# Patient Record
Sex: Female | Born: 1960 | Race: White | Hispanic: No | Marital: Single | Smoking: Never smoker
Health system: Southern US, Community
[De-identification: ages and names within clinical notes are randomized; demographics above are authoritative.]

## PROBLEM LIST (undated history)

## (undated) DIAGNOSIS — E785 Hyperlipidemia, unspecified: Secondary | ICD-10-CM

## (undated) DIAGNOSIS — E119 Type 2 diabetes mellitus without complications: Secondary | ICD-10-CM

## (undated) DIAGNOSIS — F329 Major depressive disorder, single episode, unspecified: Secondary | ICD-10-CM

## (undated) DIAGNOSIS — F32A Depression, unspecified: Secondary | ICD-10-CM

## (undated) DIAGNOSIS — K859 Acute pancreatitis without necrosis or infection, unspecified: Secondary | ICD-10-CM

## (undated) HISTORY — DX: Type 2 diabetes mellitus without complications: E11.9

## (undated) HISTORY — PX: KNEE SURGERY: SHX244

## (undated) HISTORY — DX: Hyperlipidemia, unspecified: E78.5

## (undated) HISTORY — DX: Major depressive disorder, single episode, unspecified: F32.9

## (undated) HISTORY — DX: Depression, unspecified: F32.A

---

## 1999-02-18 ENCOUNTER — Emergency Department (HOSPITAL_COMMUNITY): Admission: EM | Admit: 1999-02-18 | Discharge: 1999-02-18 | Payer: Self-pay | Admitting: Emergency Medicine

## 2000-02-06 ENCOUNTER — Encounter: Admission: RE | Admit: 2000-02-06 | Discharge: 2000-02-06 | Payer: Self-pay | Admitting: Internal Medicine

## 2000-02-06 ENCOUNTER — Encounter: Payer: Self-pay | Admitting: Internal Medicine

## 2000-02-11 ENCOUNTER — Ambulatory Visit (HOSPITAL_BASED_OUTPATIENT_CLINIC_OR_DEPARTMENT_OTHER): Admission: RE | Admit: 2000-02-11 | Discharge: 2000-02-11 | Payer: Self-pay | Admitting: *Deleted

## 2000-05-26 ENCOUNTER — Encounter: Admission: RE | Admit: 2000-05-26 | Discharge: 2000-05-26 | Payer: Self-pay | Admitting: Internal Medicine

## 2000-05-26 ENCOUNTER — Encounter: Payer: Self-pay | Admitting: Internal Medicine

## 2015-12-31 ENCOUNTER — Inpatient Hospital Stay (HOSPITAL_COMMUNITY)
Admission: EM | Admit: 2015-12-31 | Discharge: 2016-01-04 | DRG: 440 | Disposition: A | Discharge status: Home / Self Care | Payer: BLUE CROSS/BLUE SHIELD | Attending: Internal Medicine | Admitting: Internal Medicine

## 2015-12-31 ENCOUNTER — Ambulatory Visit (INDEPENDENT_AMBULATORY_CARE_PROVIDER_SITE_OTHER): Payer: BLUE CROSS/BLUE SHIELD | Admitting: Emergency Medicine

## 2015-12-31 ENCOUNTER — Emergency Department (HOSPITAL_COMMUNITY): Payer: BLUE CROSS/BLUE SHIELD

## 2015-12-31 ENCOUNTER — Ambulatory Visit (INDEPENDENT_AMBULATORY_CARE_PROVIDER_SITE_OTHER): Payer: BLUE CROSS/BLUE SHIELD

## 2015-12-31 ENCOUNTER — Encounter (HOSPITAL_COMMUNITY): Payer: Self-pay

## 2015-12-31 VITALS — BP 150/80 | HR 89 | Temp 98.6°F | Resp 14 | Ht 64.0 in | Wt 195.0 lb

## 2015-12-31 DIAGNOSIS — F1010 Alcohol abuse, uncomplicated: Secondary | ICD-10-CM | POA: Diagnosis present

## 2015-12-31 DIAGNOSIS — R112 Nausea with vomiting, unspecified: Secondary | ICD-10-CM

## 2015-12-31 DIAGNOSIS — I160 Hypertensive urgency: Secondary | ICD-10-CM | POA: Diagnosis present

## 2015-12-31 DIAGNOSIS — Z8249 Family history of ischemic heart disease and other diseases of the circulatory system: Secondary | ICD-10-CM

## 2015-12-31 DIAGNOSIS — K760 Fatty (change of) liver, not elsewhere classified: Secondary | ICD-10-CM | POA: Diagnosis present

## 2015-12-31 DIAGNOSIS — E119 Type 2 diabetes mellitus without complications: Secondary | ICD-10-CM | POA: Diagnosis not present

## 2015-12-31 DIAGNOSIS — E669 Obesity, unspecified: Secondary | ICD-10-CM | POA: Diagnosis present

## 2015-12-31 DIAGNOSIS — I10 Essential (primary) hypertension: Secondary | ICD-10-CM | POA: Diagnosis present

## 2015-12-31 DIAGNOSIS — R1013 Epigastric pain: Secondary | ICD-10-CM | POA: Diagnosis not present

## 2015-12-31 DIAGNOSIS — K852 Alcohol induced acute pancreatitis without necrosis or infection: Secondary | ICD-10-CM | POA: Diagnosis not present

## 2015-12-31 DIAGNOSIS — F1910 Other psychoactive substance abuse, uncomplicated: Secondary | ICD-10-CM

## 2015-12-31 DIAGNOSIS — F329 Major depressive disorder, single episode, unspecified: Secondary | ICD-10-CM | POA: Diagnosis present

## 2015-12-31 DIAGNOSIS — K859 Acute pancreatitis without necrosis or infection, unspecified: Secondary | ICD-10-CM | POA: Diagnosis not present

## 2015-12-31 DIAGNOSIS — Z6832 Body mass index (BMI) 32.0-32.9, adult: Secondary | ICD-10-CM | POA: Diagnosis not present

## 2015-12-31 DIAGNOSIS — F32A Depression, unspecified: Secondary | ICD-10-CM

## 2015-12-31 DIAGNOSIS — K8500 Idiopathic acute pancreatitis without necrosis or infection: Secondary | ICD-10-CM | POA: Diagnosis not present

## 2015-12-31 DIAGNOSIS — R109 Unspecified abdominal pain: Secondary | ICD-10-CM | POA: Diagnosis not present

## 2015-12-31 LAB — URINALYSIS, ROUTINE W REFLEX MICROSCOPIC
BILIRUBIN URINE: NEGATIVE
GLUCOSE, UA: NEGATIVE mg/dL
Hgb urine dipstick: NEGATIVE
KETONES UR: 15 mg/dL — AB
Leukocytes, UA: NEGATIVE
Nitrite: NEGATIVE
PH: 6.5 (ref 5.0–8.0)
Protein, ur: 30 mg/dL — AB
Specific Gravity, Urine: 1.026 (ref 1.005–1.030)

## 2015-12-31 LAB — POCT CBC
GRANULOCYTE PERCENT: 83.1 % — AB (ref 37–80)
HEMATOCRIT: 43.3 % (ref 37.7–47.9)
Hemoglobin: 15.4 g/dL (ref 12.2–16.2)
Lymph, poc: 1.5 (ref 0.6–3.4)
MCH: 29.4 pg (ref 27–31.2)
MCHC: 35.5 g/dL — AB (ref 31.8–35.4)
MCV: 83 fL (ref 80–97)
MID (CBC): 0.2 (ref 0–0.9)
MPV: 9 fL (ref 0–99.8)
POC GRANULOCYTE: 8.1 — AB (ref 2–6.9)
POC LYMPH PERCENT: 15 %L (ref 10–50)
POC MID %: 1.9 % (ref 0–12)
Platelet Count, POC: 200 10*3/uL (ref 142–424)
RBC: 5.22 M/uL (ref 4.04–5.48)
RDW, POC: 13.8 %
WBC: 9.7 10*3/uL (ref 4.6–10.2)

## 2015-12-31 LAB — COMPREHENSIVE METABOLIC PANEL
ALBUMIN: 4.5 g/dL (ref 3.5–5.0)
ALK PHOS: 144 U/L — AB (ref 38–126)
ALT: 21 U/L (ref 14–54)
AST: 24 U/L (ref 15–41)
Anion gap: 11 (ref 5–15)
BUN: 15 mg/dL (ref 6–20)
CALCIUM: 10.3 mg/dL (ref 8.9–10.3)
CO2: 19 mmol/L — AB (ref 22–32)
CREATININE: 0.63 mg/dL (ref 0.44–1.00)
Chloride: 107 mmol/L (ref 101–111)
GFR calc non Af Amer: >60 mL/min (ref >60)
GLUCOSE: 213 mg/dL — AB (ref 65–99)
Potassium: 3.6 mmol/L (ref 3.5–5.1)
SODIUM: 137 mmol/L (ref 135–145)
Total Bilirubin: 2 mg/dL — ABNORMAL HIGH (ref 0.3–1.2)
Total Protein: 7.8 g/dL (ref 6.5–8.1)

## 2015-12-31 LAB — LIPASE, BLOOD: Lipase: 306 U/L — ABNORMAL HIGH (ref 11–51)

## 2015-12-31 LAB — CBC WITH DIFFERENTIAL/PLATELET
BASOS PCT: 0 %
Basophils Absolute: 0 10*3/uL (ref 0.0–0.1)
EOS PCT: 1 %
Eosinophils Absolute: 0.1 10*3/uL (ref 0.0–0.7)
HCT: 45.4 % (ref 36.0–46.0)
Hemoglobin: 14.8 g/dL (ref 12.0–15.0)
LYMPHS PCT: 13 %
Lymphs Abs: 1.2 10*3/uL (ref 0.7–4.0)
MCH: 27.6 pg (ref 26.0–34.0)
MCHC: 32.6 g/dL (ref 30.0–36.0)
MCV: 84.7 fL (ref 78.0–100.0)
MONO ABS: 0.6 10*3/uL (ref 0.1–1.0)
MONOS PCT: 7 %
NEUTROS ABS: 7.4 10*3/uL (ref 1.7–7.7)
Neutrophils Relative %: 79 %
Platelets: 244 10*3/uL (ref 150–400)
RBC: 5.36 MIL/uL — ABNORMAL HIGH (ref 3.87–5.11)
RDW: 13.5 % (ref 11.5–15.5)
WBC: 9.3 10*3/uL (ref 4.0–10.5)

## 2015-12-31 LAB — URINE MICROSCOPIC-ADD ON

## 2015-12-31 LAB — GLUCOSE, POCT (MANUAL RESULT ENTRY): POC Glucose: 210 mg/dl — AB (ref 70–99)

## 2015-12-31 LAB — GLUCOSE, CAPILLARY: GLUCOSE-CAPILLARY: 141 mg/dL — AB (ref 65–99)

## 2015-12-31 MED ORDER — ONDANSETRON HCL 4 MG/2ML IJ SOLN: 4.0000 mg | Freq: Four times a day (QID) | INTRAMUSCULAR | Status: DC | PRN

## 2015-12-31 MED ORDER — LISINOPRIL 20 MG PO TABS
40.0000 mg | ORAL_TABLET | Freq: Every day | ORAL | Status: DC
  Administered 2015-12-31 – 2016-01-04 (×5): 40 mg via ORAL
  Filled 2015-12-31: qty 1
  Filled 2015-12-31 (×4): qty 2

## 2015-12-31 MED ORDER — ENOXAPARIN SODIUM 40 MG/0.4ML ~~LOC~~ SOLN
40.0000 mg | SUBCUTANEOUS | Status: DC
  Administered 2015-12-31 – 2016-01-03 (×4): 40 mg via SUBCUTANEOUS
  Filled 2015-12-31 (×5): qty 0.4

## 2015-12-31 MED ORDER — MORPHINE SULFATE (PF) 4 MG/ML IV SOLN
INTRAVENOUS | Status: AC
  Filled 2015-12-31: qty 1

## 2015-12-31 MED ORDER — ONDANSETRON HCL 4 MG PO TABS: 4.0000 mg | ORAL_TABLET | Freq: Four times a day (QID) | ORAL | Status: DC | PRN

## 2015-12-31 MED ORDER — FOLIC ACID 1 MG PO TABS: 1.0000 mg | ORAL_TABLET | Freq: Every day | ORAL | Status: DC

## 2015-12-31 MED ORDER — ONDANSETRON HCL 4 MG/2ML IJ SOLN
4.0000 mg | Freq: Once | INTRAMUSCULAR | Status: AC
  Administered 2015-12-31: 4 mg via INTRAVENOUS
  Filled 2015-12-31: qty 2

## 2015-12-31 MED ORDER — ADULT MULTIVITAMIN W/MINERALS CH
1.0000 | ORAL_TABLET | Freq: Every day | ORAL | Status: DC
  Administered 2016-01-01 – 2016-01-04 (×4): 1 via ORAL
  Filled 2015-12-31 (×4): qty 1

## 2015-12-31 MED ORDER — INSULIN GLARGINE 100 UNIT/ML ~~LOC~~ SOLN
6.0000 [IU] | Freq: Every day | SUBCUTANEOUS | Status: DC
  Administered 2015-12-31 – 2016-01-03 (×4): 6 [IU] via SUBCUTANEOUS
  Filled 2015-12-31 (×4): qty 0.06

## 2015-12-31 MED ORDER — PANTOPRAZOLE SODIUM 40 MG IV SOLR: 40.0000 mg | INTRAVENOUS | Status: DC

## 2015-12-31 MED ORDER — BUPROPION HCL ER (SR) 150 MG PO TB12
150.0000 mg | ORAL_TABLET | Freq: Two times a day (BID) | ORAL | Status: DC
  Administered 2015-12-31 – 2016-01-04 (×8): 150 mg via ORAL
  Filled 2015-12-31 (×12): qty 1

## 2015-12-31 MED ORDER — PANTOPRAZOLE SODIUM 40 MG IV SOLR
40.0000 mg | INTRAVENOUS | Status: DC
  Administered 2015-12-31 – 2016-01-03 (×4): 40 mg via INTRAVENOUS
  Filled 2015-12-31 (×4): qty 40

## 2015-12-31 MED ORDER — LORAZEPAM 1 MG PO TABS: 1.0000 mg | ORAL_TABLET | Freq: Four times a day (QID) | ORAL | Status: AC | PRN

## 2015-12-31 MED ORDER — HYDRALAZINE HCL 20 MG/ML IJ SOLN: 10.0000 mg | Freq: Four times a day (QID) | INTRAMUSCULAR | Status: DC | PRN

## 2015-12-31 MED ORDER — HYDROMORPHONE HCL 1 MG/ML IJ SOLN
1.0000 mg | INTRAMUSCULAR | Status: AC | PRN
  Administered 2015-12-31 – 2016-01-01 (×3): 1 mg via INTRAVENOUS
  Filled 2015-12-31 (×3): qty 1

## 2015-12-31 MED ORDER — METOPROLOL TARTRATE 1 MG/ML IV SOLN
5.0000 mg | Freq: Four times a day (QID) | INTRAVENOUS | Status: DC
  Administered 2015-12-31 – 2016-01-01 (×3): 5 mg via INTRAVENOUS
  Filled 2015-12-31 (×3): qty 5

## 2015-12-31 MED ORDER — THIAMINE HCL 100 MG/ML IJ SOLN: 100.0000 mg | Freq: Every day | INTRAMUSCULAR | Status: DC

## 2015-12-31 MED ORDER — ADULT MULTIVITAMIN W/MINERALS CH: 1.0000 | ORAL_TABLET | Freq: Every day | ORAL | Status: DC

## 2015-12-31 MED ORDER — LORAZEPAM 2 MG/ML IJ SOLN: 1.0000 mg | Freq: Four times a day (QID) | INTRAMUSCULAR | Status: AC | PRN

## 2015-12-31 MED ORDER — FLUOXETINE HCL 20 MG PO CAPS
60.0000 mg | ORAL_CAPSULE | Freq: Every morning | ORAL | Status: DC
  Administered 2016-01-01 – 2016-01-04 (×4): 60 mg via ORAL
  Filled 2015-12-31 (×4): qty 3

## 2015-12-31 MED ORDER — VITAMIN B-1 100 MG PO TABS
100.0000 mg | ORAL_TABLET | Freq: Every day | ORAL | Status: DC
  Administered 2016-01-01 – 2016-01-04 (×4): 100 mg via ORAL
  Filled 2015-12-31 (×4): qty 1

## 2015-12-31 MED ORDER — IOHEXOL 300 MG/ML  SOLN
25.0000 mL | Freq: Once | INTRAMUSCULAR | Status: AC | PRN
  Administered 2015-12-31: 25 mL via ORAL

## 2015-12-31 MED ORDER — ACETAMINOPHEN 325 MG PO TABS: 650.0000 mg | ORAL_TABLET | Freq: Four times a day (QID) | ORAL | Status: DC | PRN

## 2015-12-31 MED ORDER — SODIUM CHLORIDE 0.9 % IV BOLUS (SEPSIS)
1000.0000 mL | Freq: Once | INTRAVENOUS | Status: AC
  Administered 2015-12-31: 1000 mL via INTRAVENOUS

## 2015-12-31 MED ORDER — HYDRALAZINE HCL 20 MG/ML IJ SOLN
10.0000 mg | Freq: Once | INTRAMUSCULAR | Status: AC
  Administered 2015-12-31: 10 mg via INTRAVENOUS
  Filled 2015-12-31: qty 1

## 2015-12-31 MED ORDER — INSULIN ASPART 100 UNIT/ML ~~LOC~~ SOLN
0.0000 [IU] | SUBCUTANEOUS | Status: DC
  Administered 2015-12-31 – 2016-01-01 (×2): 1 [IU] via SUBCUTANEOUS

## 2015-12-31 MED ORDER — MORPHINE SULFATE (PF) 4 MG/ML IV SOLN
4.0000 mg | Freq: Once | INTRAVENOUS | Status: AC
  Administered 2015-12-31: 4 mg via INTRAVENOUS
  Filled 2015-12-31: qty 1

## 2015-12-31 MED ORDER — THIAMINE HCL 100 MG/ML IJ SOLN
100.0000 mg | Freq: Every day | INTRAMUSCULAR | Status: DC
  Administered 2015-12-31: 100 mg via INTRAVENOUS
  Filled 2015-12-31: qty 2

## 2015-12-31 MED ORDER — SODIUM CHLORIDE 0.45 % IV SOLN
INTRAVENOUS | Status: DC
  Administered 2015-12-31 – 2016-01-04 (×8): via INTRAVENOUS

## 2015-12-31 MED ORDER — ACETAMINOPHEN 650 MG RE SUPP: 650.0000 mg | Freq: Four times a day (QID) | RECTAL | Status: DC | PRN

## 2015-12-31 MED ORDER — SODIUM CHLORIDE 0.9 % IV SOLN
INTRAVENOUS | Status: AC
  Administered 2015-12-31: 20:00:00 via INTRAVENOUS

## 2015-12-31 MED ORDER — VITAMIN B-1 100 MG PO TABS: 100.0000 mg | ORAL_TABLET | Freq: Every day | ORAL | Status: DC

## 2015-12-31 MED ORDER — ONDANSETRON HCL 4 MG/2ML IJ SOLN: 4.0000 mg | Freq: Three times a day (TID) | INTRAMUSCULAR | Status: DC | PRN

## 2015-12-31 MED ORDER — IOHEXOL 300 MG/ML  SOLN
100.0000 mL | Freq: Once | INTRAMUSCULAR | Status: AC | PRN
  Administered 2015-12-31: 100 mL via INTRAVENOUS

## 2015-12-31 MED ORDER — MORPHINE SULFATE (PF) 2 MG/ML IV SOLN
2.0000 mg | INTRAVENOUS | Status: DC | PRN
  Administered 2016-01-01 (×4): 2 mg via INTRAVENOUS
  Filled 2015-12-31 (×6): qty 1

## 2015-12-31 MED ORDER — FOLIC ACID 1 MG PO TABS
1.0000 mg | ORAL_TABLET | Freq: Every day | ORAL | Status: DC
  Administered 2016-01-01 – 2016-01-04 (×4): 1 mg via ORAL
  Filled 2015-12-31 (×4): qty 1

## 2015-12-31 MED ORDER — ALUM & MAG HYDROXIDE-SIMETH 200-200-20 MG/5ML PO SUSP: 30.0000 mL | Freq: Four times a day (QID) | ORAL | Status: DC | PRN

## 2015-12-31 NOTE — Patient Instructions (Addendum)
Please go to [place] emergency room for further evaluation 

## 2015-12-31 NOTE — Progress Notes (Addendum)
This chart was scribed for Earl Lites, MD by [facility], medical scribe at [facility].The patient was seen in exam room 14 and the patient's care was started at 11:20 AM.  Chief Complaint:  Chief Complaint  Patient presents with  . Abdominal Pain    1 day in the epigastric area, tender to tough  . Emesis   HPI: Alyssa Garcia is a 55 y.o. female who reports to [facility] today complaining of epigastric abdominal pain which began yesterday morning. Initially felt constipated and bloated. She did vomit this morning and yesterday but no diarrhea. No fever but she did feel hot. No surgical history on her abdomen. No smoking. She has not had a drink in 4 months.  Past Medical History  Diagnosis Date  . Depression   . Hyperlipidemia   . Diabetes mellitus without complication (HCC)    No past surgical history on file. Social History   Social History  . Marital Status: Single    Spouse Name: N/A  . Number of Children: N/A  . Years of Education: N/A   Social History Main Topics  . Smoking status: Never Smoker   . Smokeless tobacco: Not on file  . Alcohol Use: No  . Drug Use: No  . Sexual Activity: Not on file   Other Topics Concern  . Not on file   Social History Narrative  . No narrative on file   No family history on file. No Known Allergies Prior to Admission medications   Medication Sig Start Date End Date Taking? Authorizing Provider  buPROPion (WELLBUTRIN) 75 MG tablet Take 75 mg by mouth 2 (two) times daily.   Yes Historical Provider, MD  FLUoxetine (PROZAC) 10 MG capsule Take 10 mg by mouth daily.   Yes Historical Provider, MD  lisinopril (PRINIVIL,ZESTRIL) 20 MG tablet Take 20 mg by mouth daily.   Yes Historical Provider, MD  metFORMIN (GLUCOPHAGE) 1000 MG tablet Take 1,000 mg by mouth 2 (two) times daily with a meal.   Yes Historical Provider, MD  metoprolol succinate (TOPROL-XL) 25 MG 24 hr tablet Take 25 mg by mouth daily.   Yes Historical  Provider, MD    ROS: The patient denies fevers, chills, night sweats, unintentional weight loss, chest pain, palpitations, wheezing, dyspnea on exertion,  dysuria, hematuria, melena, numbness, weakness, or tingling.  All other systems have been reviewed and were otherwise negative with the exception of those mentioned in the HPI and as above.    PHYSICAL EXAM: Filed Vitals:   12/31/15 1045  BP: 150/80  Pulse: 89  Temp: 98.6 F (37 C)  Resp: 14   Body mass index is 33.46 kg/(m^2).  General: Alert, no acute distress, ruddy complexion. HEENT:  Normocephalic, atraumatic, oropharynx patent. Eye: [facility] Cardiovascular:  Regular rate and rhythm, no rubs murmurs or gallops.  No Carotid bruits, radial pulse intact. No pedal edema.  Respiratory: Clear to auscultation bilaterally.  No wheezes, rales, or rhonchi.  No cyanosis, no use of accessory musculature Abdominal: Positive bowel sounds.  No masses. Significant mid-epigastric tenderness. There appears to be localized rebound to this area. Musculoskeletal: Gait intact. No edema, tenderness Skin: No rashes. Neurologic: Facial musculature symmetric. Psychiatric: Patient acts appropriately throughout our interaction. Lymphatic: No cervical or submandibular lymphadenopathy Genitourinary/Anorectal: No acute findings   LABS: No results found for this or any previous visit. Results for orders placed or performed in visit on 12/31/15  POCT glucose (manual entry)  Result Value  Ref Range   POC Glucose 210 (A) 70 - 99 mg/dl  POCT CBC  Result Value Ref Range   WBC 9.7 4.6 - 10.2 K/uL   Lymph, poc 1.5 0.6 - 3.4   POC LYMPH PERCENT 15.0 10 - 50 %L   MID (cbc) 0.2 0 - 0.9   POC MID % 1.9 0 - 12 %M   POC Granulocyte 8.1 (A) 2 - 6.9   Granulocyte percent 83.1 (A) 37 - 80 %G   RBC 5.22 4.04 - 5.48 M/uL   Hemoglobin 15.4 12.2 - 16.2 g/dL   HCT, POC 16.1 09.6 - 47.9 %   MCV 83.0 80 - 97 fL   MCH, POC 29.4 27 - 31.2 pg   MCHC 35.5 (A)  31.8 - 35.4 g/dL   RDW, POC 04.5 %   Platelet Count, POC 200 142 - 424 K/uL   MPV 9.0 0 - 99.8 fL   EKG/XRAY:   Primary read interpreted by Dr. Cleta Alberts at [facility]. Dg Abd Acute W/chest  12/31/2015  CLINICAL DATA:  Patient with diffuse abdominal pain, nausea and vomiting. EXAM: DG ABDOMEN ACUTE W/ 1V CHEST COMPARISON:  None. FINDINGS: Normal cardiac and mediastinal contours. No consolidative pulmonary opacities. No pleural effusion or pneumothorax. Gas is demonstrated within nondilated loops of large and small bowel in a nonobstructed pattern. No free intraperitoneal air. Unremarkable osseous skeleton. IMPRESSION: No acute cardiopulmonary process. Nonobstructed bowel gas pattern. Electronically Signed   By: Annia Belt M.D.   On: 12/31/2015 12:05   ASSESSMENT/PLAN: Patient has significant upper abdominal discomfort. Plain films are unremarkable white count normal. She was unable to obtain urine. Patient advised to register to be seen in the emergency room at [address] and evaluated for upper abdominal pain rule out pancreatitis rule out penetrating ulcer rule out gallbladder disease. We were not able to obtain a urine here in our office. Gross sideeffects, risk and benefits, and alternatives of medications d/w patient. Patient is aware that all medications have potential sideeffects and we are unable to predict every sideeffect or drug-drug interaction that may occur.  By signing my name below, I, Nadim Abuhashem, attest that this documentation has been prepared under the direction and in the presence of Earl Lites, MD.  Electronically Signed: Conchita Paris, medical scribe. 12/31/2015, 11:27 AM.  Lesle Chris MD 12/31/2015 11:20 AM

## 2015-12-31 NOTE — H&P (Addendum)
[facility] History and Physical  Alyssa Garcia WJX:[id number] DOB: 08/25/1961 DOA: 12/31/2015   PCP: Pcp Not In System    Chief Complaint: abdominal pain, vomiting  HPI: Alyssa Garcia is a 55 y.o. female with DM, depression, HTN presents with 2 days of mid abdominal pain, aching in nature, 8/10 with vomiting. No fevers and no diarrhea. She is found by blood work and imaging to have acute pancreatitis. She binge drinks and last drank about 5 days ago. No h/o gallstones or hypertriglyceridemia. No h/o pancreatitis in the past.     General: The patient denies anorexia, fever, weight loss of 50 lb in 6 months due to being sick in the hospital Cardiac: Denies chest pain, syncope, palpitations, pedal edema  Respiratory: Denies cough, shortness of breath, wheezing GI: Denies severe indigestion/heartburn, abdominal pain, nausea, vomiting, diarrhea and constipation GU: Denies hematuria, incontinence, dysuria  Musculoskeletal: mild arthritis  Skin: Denies suspicious skin lesions Neurologic: Denies focal weakness or numbness, change in vision Psychiatry: Denies depression or anxiety. Hematologic: + easy  bruising   All other systems reviewed and found to be negative.  Past Medical History  Diagnosis Date  . Depression   . Hyperlipidemia   . Diabetes mellitus without complication [facility])     Past Surgical History  Procedure Laterality Date  . Knee surgery      Social History: drank 2 5th of Vodka on Monday and Tuesday- drinks intermittently does not smoke On Medical leave and currently not working.    No Known Allergies  Family history:  Grandmother had heart disease. Sister has COPD    Prior to Admission medications   Medication Sig Start Date End Date Taking? Authorizing Provider  buPROPion (WELLBUTRIN SR) 150 MG 12 hr tablet Take 150 mg by mouth 2 (two) times daily. 11/03/15  Yes Historical Provider, MD  FLUoxetine (PROZAC) 20 MG capsule Take 60 mg by mouth every  morning. 11/03/15  Yes Historical Provider, MD  ibuprofen (ADVIL,MOTRIN) 200 MG tablet Take 400 mg by mouth every 6 (six) hours as needed for mild pain or moderate pain.   Yes Historical Provider, MD  LANTUS SOLOSTAR 100 UNIT/ML Solostar Pen Inject 10-12 Units into the skin 2 (two) times daily. 12 units every morning and 10 every night 12/24/15  Yes Historical Provider, MD  lisinopril (PRINIVIL,ZESTRIL) 40 MG tablet Take 40 mg by mouth daily. 11/30/15  Yes Historical Provider, MD  metFORMIN (GLUCOPHAGE) 1000 MG tablet Take 1,000 mg by mouth 2 (two) times daily with a meal.   Yes Historical Provider, MD  metoprolol (LOPRESSOR) 100 MG tablet Take 100 mg by mouth daily. 11/30/15  Yes Historical Provider, MD  omeprazole (PRILOSEC OTC) 20 MG tablet Take 20 mg by mouth daily.   Yes Historical Provider, MD     Physical Exam: Filed Vitals:   12/31/15 1343 12/31/15 1601 12/31/15 1738  BP: 183/109 190/100 198/105  Pulse: 88 88 95  Temp: 98.2 F (36.8 C)    TempSrc: Oral    Resp: SpO2: 100% 100% 100%     General: AAO x3, no acute distress HEENT: Normocephalic and Atraumatic, Mucous membranes pink                PERRLA; EOM intact; No scleral icterus,                 Nares: Patent, OropharyMICHIAH MASSEFair Dentition  Neck: FROM, no cervical lymphadenopathy, thyromegaly, carotid bruit or JVD;  Breasts: deferred CHEST WALL: No tenderness  CHEST: Normal respiration, clear to auscultation bilaterally  HEART: Regular rate and rhythm; no murmurs rubs or gallops  BACK: No kyphosis or scoliosis; no CVA tenderness  GI: Positive Bowel Sounds, soft,  Tender in mid abdomen; no masses, no organomegaly Rectal Exam: deferred MSK: No cyanosis, clubbing, or edema Genitalia: not examined  SKIN:  no rash or ulceration  CNS: Alert and Oriented x 4, Nonfocal exam, CN 2-12 intact  Labs on Admission:  Basic Metabolic Panel:  Recent Labs Lab 12/31/15 1418  NA 137  K 3.6  CL 107  CO2  19*  GLUCOSE 213*  BUN 15  CREATININE 0.63  CALCIUM 10.3   Liver Function Tests:  Recent Labs Lab 12/31/15 1418  AST 24  ALT 21  ALKPHOS 144*  BILITOT 2.0*  PROT 7.8  ALBUMIN 4.5    Recent Labs Lab 12/31/15 1418  LIPASE 306*   No results for input(s): AMMONIA in the last 168 hours. CBC:  Recent Labs Lab 12/31/15 1209 12/31/15 1418  WBC 9.7 9.3  NEUTROABS  --  7.4  HGB 15.4 14.8  HCT 43.3 45.4  MCV 83.0 84.7  PLT  --  244   Cardiac Enzymes: No results for input(s): CKTOTAL, CKMB, CKMBINDEX, TROPONINI in the last 168 hours.  BNP (last 3 results) No results for input(s): BNP in the last 8760 hours.  ProBNP (last 3 results) No results for input(s): PROBNP in the last 8760 hours.  CBG: No results for input(s): GLUCAP in the last 168 hours.  Radiological Exams on Admission: Ct Abdomen Pelvis W Contrast  12/31/2015  CLINICAL DATA:  Mid abdominal pain with nausea vomiting and diarrhea. Elevated serum lipase level EXAM: CT ABDOMEN AND PELVIS WITH CONTRAST TECHNIQUE: Multidetector CT imaging of the abdomen and pelvis was performed using the standard protocol following bolus administration of intravenous contrast. CONTRAST:  OMNIPAQUE IOHEXOL 300 MG/ML SOLN, 25mL OMNIPAQUE IOHEXOL 300 MG/ML SOLN COMPARISON:  12/31/2015 ; report dated 02/06/2000 FINDINGS: Lower chest:  Mild cardiomegaly. Hepatobiliary: Mild diffuse hepatic steatosis. Pancreas: Diffuse peripancreatic stranding compatible with acute pancreatitis. No findings of necrosis, abscess, or pseudocyst. Spleen: Unremarkable Adrenals/Urinary Tract: Unremarkable Stomach/Bowel: Unremarkable Vascular/Lymphatic: Upper peripancreatic lymph node 1.5 cm in short axis on image 22 series 2. Portacaval node 1.3 cm in short axis, image 24 series 2. Porta hepatis node 1.3 cm in short axis, image 27 series 2. These lymph nodes are likely reactive. Reproductive: Unremarkable Other: Small amount of perihepatic and perisplenic  ascites. Trace fluid in the paracolic gutters. Musculoskeletal: Lower lumbar spondylosis and degenerative disc disease. A transitional lumbosacral vertebra is assumed to represent the S1 level. Careful correlation with this numbering strategy prior to any procedural intervention would be recommended. There is potentially prominent central narrowing of the thecal sac at the L4-5 level especially eccentric to the left due to disc bulge and disc protrusion along with at least mild left foraminal stenosis at this level. There is also intervertebral spurring at the L5-S1 level. Infraumbilical hernia contains adipose tissue. IMPRESSION: 1. Acute pancreatitis. No pancreatic abscess, pseudocyst, or necrosis. Peripancreatic stranding observed with a small amount of perihepatic and perisplenic ascites. Surrounding porta hepatis reactive lymph nodes. 2. Lumbar spondylosis and degenerative disc disease potentially with considerable impingement at the L4-5 level (please note that S1 is transitional). 3. Infraumbilical hernia contains adipose tissue. 4. Mild cardiomegaly. 5. Mild diffuse hepatic steatosis. Electronically Signed  By: Gaylyn Rong M.D.   On: 12/31/2015 17:04   Dg Abd Acute W/chest  12/31/2015  CLINICAL DATA:  Patient with diffuse abdominal pain, nausea and vomiting. EXAM: DG ABDOMEN ACUTE W/ 1V CHEST COMPARISON:  None. FINDINGS: Normal cardiac and mediastinal contours. No consolidative pulmonary opacities. No pleural effusion or pneumothorax. Gas is demonstrated within nondilated loops of large and small bowel in a nonobstructed pattern. No free intraperitoneal air. Unremarkable osseous skeleton. IMPRESSION: No acute cardiopulmonary process. Nonobstructed bowel gas pattern. Electronically Signed   By: Annia Belt M.D.   On: 12/31/2015 12:05      Assessment/Plan Principal Problem:   Acute pancreatitis - possibly from ETOH abuse- check Triglycerides- no gallstones noted on CT scan - NPO, IVF and  symptomatic treatment of pain and nausea  Active Problems:   Hypertensive urgency - IV Lopressor and Hydralazine ordered - she takes Lisinopril and Metoprolol at home - will need to give 1/2 NS rather than NS to prevent further rise in BP from sodium  ETOH abuse - if she does binge drink as she states, she should not have any withdrawal as she last drank about 5 days ago for 2 days in a row - will follow for withdrawal  Hepatic steatosis - either due to ETOH use or NASH or both    DM type 2 goal A1C below 7.5 - start insulin sliding scale Q 4 hrs- she takes Metformin which will be held - she states her last A1c was 6    Depression - cont Welbutrin and Prozac  Obesity   Consulted: none  Code Status: Full code Family Communication:  (POA) Raliegh Scarlet [phone] DVT Prophylaxis:Lovenox  Time spent: 50 min  Aldine Chakraborty, MD [facility]  If 7PM-7AM, please contact night-coverage [email or web address] 12/31/2015, 6:35 PM

## 2015-12-31 NOTE — ED Notes (Signed)
Patient transported to CT 

## 2015-12-31 NOTE — Addendum Note (Signed)
Addended by: Damita Lack on: 12/31/2015 01:43 PM   Modules accepted: Orders

## 2015-12-31 NOTE — ED Notes (Signed)
Pt cannot use restroom at this time, aware urine specimen is needed.  

## 2015-12-31 NOTE — ED Notes (Signed)
Dr. Butler Denmark paged in regards to patient's blood pressure. Medication orders given. Patient will be transported following medication administration. Dr. Butler Denmark is at the bedside now. Lupita Leash, RN on 3rd called and updated.

## 2015-12-31 NOTE — ED Provider Notes (Signed)
CSN: [id number]     Arrival date & time 12/31/15  1326 History   First MD Initiated Contact with Patient 12/31/15 1542     Chief Complaint  Patient presents with  . Abdominal Pain  . Emesis     (Consider location/radiation/quality/duration/timing/severity/associated sxs/prior Treatment) The history is provided by the patient and medical records.     Pt with hx HLD, DM, depression sent from [facility] for concerning epigastric pain.  Pt has had epigastric pain that is "full" and radiates to the back that began yesterday, associated sensation of bloating and constipation, multiple episodes of vomiting, has felt hot.  Emesis was contents of her stomach and then yellow.  Had several loose nonbloody bowel movements today after taking sennacot.  She also took zantac and pepto bismol without relief.  She has never been diagnosed with gallstones.  Notes she is a heavy ETOH drinker but has been trying to cut down, last drink was "earlier this week."  Has never had pancreatitis before.  No hx abdominal surgeries.   Acute abd series and CBC POCT performed at Urgent Care without significant abnormality.   Past Medical History  Diagnosis Date  . Depression   . Hyperlipidemia   . Diabetes mellitus without complication [facility])    Past Surgical History  Procedure Laterality Date  . Knee surgery     History reviewed. No pertinent family history. Social History  Substance Use Topics  . Smoking status: Never Smoker   . Smokeless tobacco: None  . Alcohol Use: No   OB History    No data available     Review of Systems  All other systems reviewed and are negative.     Allergies  Review of patient's allergies indicates no known allergies.  Home Medications   Prior to Admission medications   Medication Sig Start Date End Date Taking? Authorizing Provider  buPROPion (WELLBUTRIN) 75 MG tablet Take 75 mg by mouth 2 (two) times daily.    Historical Provider, MD  FLUoxetine  (PROZAC) 10 MG capsule Take 10 mg by mouth daily.    Historical Provider, MD  lisinopril (PRINIVIL,ZESTRIL) 20 MG tablet Take 20 mg by mouth daily.    Historical Provider, MD  metFORMIN (GLUCOPHAGE) 1000 MG tablet Take 1,000 mg by mouth 2 (two) times daily with a meal.    Historical Provider, MD  metoprolol succinate (TOPROL-XL) 25 MG 24 hr tablet Take 25 mg by mouth daily.    Historical Provider, MD   BP 183/109 mmHg  Pulse 88  Temp(Src) 98.2 F (36.8 C) (Oral)  Resp 18  SpO2 100% Physical Exam  Constitutional: She appears well-developed and well-nourished. No distress.  HENT:  Head: Normocephalic and atraumatic.  Neck: Neck supple.  Cardiovascular: Normal rate and regular rhythm.   Pulmonary/Chest: Effort normal and breath sounds normal. No respiratory distress. She has no wheezes. She has no rales.  Abdominal: Soft. There is tenderness in the epigastric area. There is no rebound and no guarding.  Neurological: She is alert.  Skin: She is not diaphoretic.  Nursing note and vitals reviewed.   ED Course  Procedures (including critical care time) Labs Review Labs Reviewed  LIPASE, BLOOD - Abnormal; Notable for the following:    Lipase 306 (*)    All other components within normal limits  COMPREHENSIVE METABOLIC PANEL - Abnormal; Notable for the following:    CO2 19 (*)    Glucose, Bld 213 (*)    Alkaline Phosphatase 144 (*)  Total Bilirubin 2.0 (*)    All other components within normal limits  URINALYSIS, ROUTINE W REFLEX MICROSCOPIC (NOT AT [facility])  CBC WITH DIFFERENTIAL/PLATELET    Imaging Review Dg Abd Acute W/chest  12/31/2015  CLINICAL DATA:  Patient with diffuse abdominal pain, nausea and vomiting. EXAM: DG ABDOMEN ACUTE W/ 1V CHEST COMPARISON:  None. FINDINGS: Normal cardiac and mediastinal contours. No consolidative pulmonary opacities. No pleural effusion or pneumothorax. Gas is demonstrated within nondilated loops of large and small bowel in a nonobstructed  pattern. No free intraperitoneal air. Unremarkable osseous skeleton. IMPRESSION: No acute cardiopulmonary process. Nonobstructed bowel gas pattern. Electronically Signed   By: Lovey Newcomer M.D.   On: 12/31/2015 12:05   I have personally reviewed and evaluated these images and lab results as part of my medical decision-making.   EKG Interpretation None       5:42 PM Pt admitted to [facility], Dr Wynelle Cleveland accepting.    MDM   Final diagnoses:  Acute pancreatitis, unspecified pancreatitis type  Alcohol abuse    Afebrile nontoxic patient p/w epigastric pain, N/V.  Labs significant for lipase 306, blood glucose 213, bilirubin2, alk phos 144.  Labs c/w pancreatitis.  This is patient's first episode of pancreatitis, likely related to ETOH use.  CT abd/pelvis demonstrates pancreatitis without complication.  IVF, pain, nausea medications given.  Admitted to [facility].     [place], PA-C 01/01/16 5300  Sherwood Gambler, MD 01/02/16 0040

## 2015-12-31 NOTE — ED Notes (Signed)
Bed: BM84 Expected date:  Expected time:  Means of arrival:  Comments: EMS- 55 yo ? Urostomy infection

## 2015-12-31 NOTE — ED Notes (Signed)
Per pt, here with abdominal pain and emesis.  No fever.  Some loose stool.  Pt went to urgent care and sent here.

## 2016-01-01 LAB — COMPREHENSIVE METABOLIC PANEL
ALT: 16 U/L (ref 14–54)
ANION GAP: 10 (ref 5–15)
AST: 17 U/L (ref 15–41)
Albumin: 3.7 g/dL (ref 3.5–5.0)
Alkaline Phosphatase: 117 U/L (ref 38–126)
BUN: 11 mg/dL (ref 6–20)
CALCIUM: 9.4 mg/dL (ref 8.9–10.3)
CO2: 22 mmol/L (ref 22–32)
CREATININE: 0.66 mg/dL (ref 0.44–1.00)
Chloride: 107 mmol/L (ref 101–111)
GFR calc non Af Amer: >60 mL/min (ref >60)
GLUCOSE: 120 mg/dL — AB (ref 65–99)
Potassium: 3.3 mmol/L — ABNORMAL LOW (ref 3.5–5.1)
SODIUM: 139 mmol/L (ref 135–145)
Total Bilirubin: 1.1 mg/dL (ref 0.3–1.2)
Total Protein: 6.4 g/dL — ABNORMAL LOW (ref 6.5–8.1)

## 2016-01-01 LAB — CBC
HCT: 40.7 % (ref 36.0–46.0)
Hemoglobin: 13.1 g/dL (ref 12.0–15.0)
MCH: 27.6 pg (ref 26.0–34.0)
MCHC: 32.2 g/dL (ref 30.0–36.0)
MCV: 85.9 fL (ref 78.0–100.0)
PLATELETS: 163 10*3/uL (ref 150–400)
RBC: 4.74 MIL/uL (ref 3.87–5.11)
RDW: 13.5 % (ref 11.5–15.5)
WBC: 7.8 10*3/uL (ref 4.0–10.5)

## 2016-01-01 LAB — COMPLETE METABOLIC PANEL WITH GFR
ALBUMIN: 4.4 g/dL (ref 3.6–5.1)
ALK PHOS: 146 U/L — AB (ref 33–130)
ALT: 18 U/L (ref 6–29)
AST: 21 U/L (ref 10–35)
BUN: 13 mg/dL (ref 7–25)
CALCIUM: 10.4 mg/dL (ref 8.6–10.4)
CHLORIDE: 102 mmol/L (ref 98–110)
CO2: 21 mmol/L (ref 20–31)
Creat: 0.71 mg/dL (ref 0.50–1.05)
Glucose, Bld: 195 mg/dL — ABNORMAL HIGH (ref 65–99)
POTASSIUM: 4.4 mmol/L (ref 3.5–5.3)
Sodium: 135 mmol/L (ref 135–146)
Total Bilirubin: 1.4 mg/dL — ABNORMAL HIGH (ref 0.2–1.2)
Total Protein: 7 g/dL (ref 6.1–8.1)

## 2016-01-01 LAB — LIPID PANEL
Cholesterol: 142 mg/dL (ref 0–200)
HDL: 39 mg/dL — ABNORMAL LOW (ref >40)
LDL CALC: 79 mg/dL (ref 0–99)
Total CHOL/HDL Ratio: 3.6 RATIO
Triglycerides: 121 mg/dL (ref <150)
VLDL: 24 mg/dL (ref 0–40)

## 2016-01-01 LAB — GLUCOSE, CAPILLARY
GLUCOSE-CAPILLARY: 112 mg/dL — AB (ref 65–99)
GLUCOSE-CAPILLARY: 128 mg/dL — AB (ref 65–99)
GLUCOSE-CAPILLARY: 104 mg/dL — AB (ref 65–99)
Glucose-Capillary: 118 mg/dL — ABNORMAL HIGH (ref 65–99)
Glucose-Capillary: 108 mg/dL — ABNORMAL HIGH (ref 65–99)
Glucose-Capillary: 113 mg/dL — ABNORMAL HIGH (ref 65–99)

## 2016-01-01 LAB — CREATININE, SERUM
CREATININE: 0.71 mg/dL (ref 0.44–1.00)
GFR calc Af Amer: >60 mL/min (ref >60)

## 2016-01-01 LAB — AMYLASE: AMYLASE: 169 U/L — AB (ref 0–105)

## 2016-01-01 LAB — LIPASE: LIPASE: 362 U/L — AB (ref 7–60)

## 2016-01-01 LAB — LIPASE, BLOOD: LIPASE: 177 U/L — AB (ref 11–51)

## 2016-01-01 MED ORDER — LABETALOL HCL 5 MG/ML IV SOLN
20.0000 mg | Freq: Four times a day (QID) | INTRAVENOUS | Status: DC
  Administered 2016-01-01 – 2016-01-03 (×10): 20 mg via INTRAVENOUS
  Filled 2016-01-01 (×16): qty 4

## 2016-01-01 MED ORDER — POTASSIUM CHLORIDE 10 MEQ/100ML IV SOLN
10.0000 meq | INTRAVENOUS | Status: AC
Start: 2016-01-01 — End: 2016-01-01
  Administered 2016-01-01 (×4): 10 meq via INTRAVENOUS
  Filled 2016-01-01 (×4): qty 100

## 2016-01-01 NOTE — Progress Notes (Signed)
[facility] Progress Note   Alyssa Garcia  [id number]  DOB: Aug 29, 1961  DOA: 12/31/2015 PCP: Pcp Not In System  Brief narrative: Alyssa Garcia is a 55 y.o. female female with DM, depression, HTN presents with 2 days of mid abdominal pain, aching in nature, 8/10 with vomiting. No fevers and no diarrhea. She is found by blood work and imaging to have acute pancreatitis. She binge drinks and last drank about 5 days ago. No h/o gallstones or hypertriglyceridemia. No h/o pancreatitis in the past.    Subjective: Still having mid abdominal pain, has nausea with drinking water. No fever, vomiting or diarrhea.   Assessment/Plan: Acute pancreatitis - possibly from ETOH abuse- Triglycerides normal- no gallstones noted on CT scan- Tbili and Alk phos were initially elevated but have normalized - continue NPO, IVF and symptomatic treatment of pain and nausea  Active Problems:  Hypertensive urgency - IV Lopressor, oral Lisinopril and IV Hydralazine ordered but BP still elevated although pain is controlled- will change Lopressor to Labetalol - she takes Lisinopril and Metoprolol at home - will continue to give 1/2 NS rather than NS to prevent further rise in BP from sodium  ETOH abuse - if she does binge drink as she states, she should not have any withdrawal as she last drank about 5 days ago for 2 days in a row - no signs of withdrawal  Hepatic steatosis - either due to ETOH use or NASH or both   DM type 2 goal A1C below 7.5 - sugars controlled on low dose Lantus and insulin sliding scale Q 4 hrs - Metformin is being held - she states her last A1c was 6   Depression - cont Welbutrin and Prozac    Antibiotics: Anti-infectives    None     Code Status:     Code Status Orders        Start     Ordered   12/31/15 1828  Full code   Continuous     12/31/15 1828    Code Status History    Date Active Date Inactive Code Status Order ID Comments User Context   This  patient has a current code status but no historical code status.     Family Communication:   Disposition Plan: cont NPO until pain resolves - will likely return home when stable DVT prophylaxis: Lovenox Consultants:  Procedures:     Objective: Filed Weights   12/31/15 2004  Weight: 84.913 kg (187 lb 3.2 oz)    Intake/Output Summary (Last 24 hours) at 01/01/16 1324 Last data filed at 01/01/16 1320  Gross per 24 hour  Intake      0 ml  Output    900 ml  Net   -900 ml     Vitals Filed Vitals:   01/01/16 0015 01/01/16 0221 01/01/16 0531 01/01/16 0808  BP: 165/88 157/88 178/95 170/92  Pulse: 95  85 80  Temp:   98.9 F (37.2 C)   TempSrc:   Oral   Resp:   16   Height:      Weight:      SpO2:   100%     Exam:  General:  Pt is alert, not in acute distress  HEENT: No icterus, No thrush, oral mucosa moist  Cardiovascular: regular rate and rhythm, S1/S2 No murmur  Respiratory: clear to auscultation bilaterally   Abdomen: Soft, +Bowel sounds, tender in epigastrium and mid abdomen, non distended, no guarding  MSK: No cyanosis or  clubbing- no pedal edema   Data Reviewed: Basic Metabolic Panel:  Recent Labs Lab 12/31/15 1131 12/31/15 1418 12/31/15 1834 01/01/16 0409  NA 135 137  --  139  K 4.4 3.6  --  3.3*  CL 102 107  --  107  CO2 21 19*  --  22  GLUCOSE 195* 213*  --  120*  BUN 13 15  --  11  CREATININE 0.71 0.63 0.71 0.66  CALCIUM 10.4 10.3  --  9.4   Liver Function Tests:  Recent Labs Lab 12/31/15 1131 12/31/15 1418 01/01/16 0409  AST 21 24 17   ALT 18 21 16   ALKPHOS 146* 144* 117  BILITOT 1.4* 2.0* 1.1  PROT 7.0 7.8 6.4*  ALBUMIN 4.4 4.5 3.7    Recent Labs Lab 12/31/15 1131 12/31/15 1418 01/01/16 0409  LIPASE 362* 306* 177*  AMYLASE 169*  --   --    No results for input(s): AMMONIA in the last 168 hours. CBC:  Recent Labs Lab 12/31/15 1209 12/31/15 1418 01/01/16 0409  WBC 9.7 9.3 7.8  NEUTROABS  --  7.4  --   HGB 15.4 14.8  13.1  HCT 43.3 45.4 40.7  MCV 83.0 84.7 85.9  PLT  --  244 163   Cardiac Enzymes: No results for input(s): CKTOTAL, CKMB, CKMBINDEX, TROPONINI in the last 168 hours. BNP (last 3 results) No results for input(s): BNP in the last 8760 hours.  ProBNP (last 3 results) No results for input(s): PROBNP in the last 8760 hours.  CBG:  Recent Labs Lab 12/31/15 2000 01/01/16 0217 01/01/16 0536 01/01/16 0805  GLUCAP 141* 118* 112* 128*    No results found for this or any previous visit (from the past 240 hour(s)).   Studies: Ct Abdomen Pelvis W Contrast  12/31/2015  CLINICAL DATA:  Mid abdominal pain with nausea vomiting and diarrhea. Elevated serum lipase level EXAM: CT ABDOMEN AND PELVIS WITH CONTRAST TECHNIQUE: Multidetector CT imaging of the abdomen and pelvis was performed using the standard protocol following bolus administration of intravenous contrast. CONTRAST:  15m OMNIPAQUE IOHEXOL 300 MG/ML SOLN, 270mOMNIPAQUE IOHEXOL 300 MG/ML SOLN COMPARISON:  12/31/2015 ; report dated 02/06/2000 FINDINGS: Lower chest:  Mild cardiomegaly. Hepatobiliary: Mild diffuse hepatic steatosis. Pancreas: Diffuse peripancreatic stranding compatible with acute pancreatitis. No findings of necrosis, abscess, or pseudocyst. Spleen: Unremarkable Adrenals/Urinary Tract: Unremarkable Stomach/Bowel: Unremarkable Vascular/Lymphatic: Upper peripancreatic lymph node 1.5 cm in short axis on image 22 series 2. Portacaval node 1.3 cm in short axis, image 24 series 2. Porta hepatis node 1.3 cm in short axis, image 27 series 2. These lymph nodes are likely reactive. Reproductive: Unremarkable Other: Small amount of perihepatic and perisplenic ascites. Trace fluid in the paracolic gutters. Musculoskeletal: Lower lumbar spondylosis and degenerative disc disease. A transitional lumbosacral vertebra is assumed to represent the S1 level. Careful correlation with this numbering strategy prior to any procedural intervention would be  recommended. There is potentially prominent central narrowing of the thecal sac at the L4-5 level especially eccentric to the left due to disc bulge and disc protrusion along with at least mild left foraminal stenosis at this level. There is also intervertebral spurring at the L5-S1 level. Infraumbilical hernia contains adipose tissue. IMPRESSION: 1. Acute pancreatitis. No pancreatic abscess, pseudocyst, or necrosis. Peripancreatic stranding observed with a small amount of perihepatic and perisplenic ascites. Surrounding porta hepatis reactive lymph nodes. 2. Lumbar spondylosis and degenerative disc disease potentially with considerable impingement at the L4-5 level (please note that S1 is  transitional). 3. Infraumbilical hernia contains adipose tissue. 4. Mild cardiomegaly. 5. Mild diffuse hepatic steatosis. Electronically Signed   By: Van Clines M.D.   On: 12/31/2015 17:04   Dg Abd Acute W/chest  12/31/2015  CLINICAL DATA:  Patient with diffuse abdominal pain, nausea and vomiting. EXAM: DG ABDOMEN ACUTE W/ 1V CHEST COMPARISON:  None. FINDINGS: Normal cardiac and mediastinal contours. No consolidative pulmonary opacities. No pleural effusion or pneumothorax. Gas is demonstrated within nondilated loops of large and small bowel in a nonobstructed pattern. No free intraperitoneal air. Unremarkable osseous skeleton. IMPRESSION: No acute cardiopulmonary process. Nonobstructed bowel gas pattern. Electronically Signed   By: Lovey Newcomer M.D.   On: 12/31/2015 12:05    Scheduled Meds:  Scheduled Meds: . buPROPion  150 mg Oral BID  . enoxaparin (LOVENOX) injection  40 mg Subcutaneous Q24H  . FLUoxetine  60 mg Oral q morning - 47M  . folic acid  1 mg Oral Daily  . insulin aspart  0-9 Units Subcutaneous Q4H  . insulin glargine  6 Units Subcutaneous QHS  . labetalol  20 mg Intravenous Q6H  . lisinopril  40 mg Oral Daily  . multivitamin with minerals  1 tablet Oral Daily  . pantoprazole (PROTONIX) IV  40  mg Intravenous Q24H  . thiamine  100 mg Oral Daily   Or  . thiamine  100 mg Intravenous Daily   Continuous Infusions: . sodium chloride 125 mL/hr at 01/01/16 0957    Time spent on care of this patient: 104 min   [place], MD 01/01/2016, 1:24 PM  LOS: 1 day   [facility] Office  [phone] Pager - Text Page per [email or web address] If 7PM-7AM, please contact night-coverage [email or web address]

## 2016-01-02 LAB — BASIC METABOLIC PANEL
ANION GAP: 10 (ref 5–15)
BUN: 7 mg/dL (ref 6–20)
CO2: 20 mmol/L — ABNORMAL LOW (ref 22–32)
Calcium: 9.3 mg/dL (ref 8.9–10.3)
Chloride: 109 mmol/L (ref 101–111)
Creatinine, Ser: 0.68 mg/dL (ref 0.44–1.00)
GFR calc Af Amer: >60 mL/min (ref >60)
GLUCOSE: 121 mg/dL — AB (ref 65–99)
POTASSIUM: 3.7 mmol/L (ref 3.5–5.1)
SODIUM: 139 mmol/L (ref 135–145)

## 2016-01-02 LAB — GLUCOSE, CAPILLARY
GLUCOSE-CAPILLARY: 119 mg/dL — AB (ref 65–99)
GLUCOSE-CAPILLARY: 177 mg/dL — AB (ref 65–99)
Glucose-Capillary: 108 mg/dL — ABNORMAL HIGH (ref 65–99)
Glucose-Capillary: 108 mg/dL — ABNORMAL HIGH (ref 65–99)
Glucose-Capillary: 152 mg/dL — ABNORMAL HIGH (ref 65–99)

## 2016-01-02 LAB — LIPASE, BLOOD: Lipase: 56 U/L — ABNORMAL HIGH (ref 11–51)

## 2016-01-02 LAB — MAGNESIUM: MAGNESIUM: 1.5 mg/dL — AB (ref 1.7–2.4)

## 2016-01-02 MED ORDER — INSULIN ASPART 100 UNIT/ML ~~LOC~~ SOLN: 0.0000 [IU] | Freq: Three times a day (TID) | SUBCUTANEOUS | Status: DC

## 2016-01-02 MED ORDER — INSULIN ASPART 100 UNIT/ML ~~LOC~~ SOLN
0.0000 [IU] | Freq: Three times a day (TID) | SUBCUTANEOUS | Status: DC
  Administered 2016-01-02 (×2): 2 [IU] via SUBCUTANEOUS
  Administered 2016-01-03 – 2016-01-04 (×4): 1 [IU] via SUBCUTANEOUS

## 2016-01-02 MED ORDER — INSULIN ASPART 100 UNIT/ML ~~LOC~~ SOLN: 0.0000 [IU] | Freq: Every day | SUBCUTANEOUS | Status: DC

## 2016-01-02 NOTE — Progress Notes (Addendum)
[facility] Progress Note   Alyssa Garcia  [id number]  DOB: 1960/11/30  DOA: 12/31/2015 PCP: Pcp Not In System  Brief narrative: Alyssa Garcia is a 55 y.o. female female with DM, depression, HTN presents with 2 days of mid abdominal pain, aching in nature, 8/10 with vomiting. No fevers and no diarrhea. She is found by blood work and imaging to have acute pancreatitis. She binge drinks and last drank about 5 days ago. No h/o gallstones or hypertriglyceridemia. No h/o pancreatitis in the past.    Subjective: No longer having abdominal pain or nausea.  No fever, vomiting or diarrhea.   Assessment/Plan: Acute pancreatitis - possibly from ETOH abuse- Triglycerides normal- no gallstones noted on CT scan- Tbili and Alk phos were initially elevated but have normalized - start clear liquids today- continue IVF and symptomatic treatment of pain and nausea  Active Problems:  Hypertensive urgency - IV Lopressor, oral Lisinopril and IV Hydralazine ordered but BP still elevated although pain is controlled- have changed Lopressor to Labetalol - she takes Lisinopril and Metoprolol at home - will continue to give 1/2 NS rather than NS to prevent further rise in BP from sodium  ETOH abuse - if she does binge drink as she states, she should not have any withdrawal as she last drank about 5 days ago for 2 days in a row - no signs of withdrawal  Hepatic steatosis - either due to ETOH use or NASH or both   DM type 2 goal A1C below 7.5 - sugars controlled on low dose Lantus and insulin sliding scale Q 4 hrs - Metformin is being held - she states her last A1c was 6   Depression - cont Welbutrin and Prozac    Antibiotics: Anti-infectives    None     Code Status:     Code Status Orders        Start     Ordered   12/31/15 1828  Full code   Continuous     12/31/15 1828    Code Status History    Date Active Date Inactive Code Status Order ID Comments User Context   This  patient has a current code status but no historical code status.     Family Communication:   Disposition Plan:  will likely return home in 1-2 days when stable DVT prophylaxis: Lovenox Consultants:  Procedures:     Objective: Filed Weights   12/31/15 2004  Weight: 84.913 kg (187 lb 3.2 oz)    Intake/Output Summary (Last 24 hours) at 01/02/16 1743 Last data filed at 01/02/16 1730  Gross per 24 hour  Intake 3746.25 ml  Output   2550 ml  Net 1196.25 ml     Vitals Filed Vitals:   01/02/16 0500 01/02/16 0800 01/02/16 1330 01/02/16 1340  BP: 178/93 169/89 155/91 167/97  Pulse: 79 94 85 89  Temp:   98.6 F (37 C)   TempSrc:   Oral   Resp:   16   Height:      Weight:      SpO2:   100%     Exam:  General:  Pt is alert, not in acute distress  HEENT: No icterus, No thrush, oral mucosa moist  Cardiovascular: regular rate and rhythm, S1/S2 No murmur  Respiratory: clear to auscultation bilaterally   Abdomen: Soft, +Bowel sounds, no tenderness today, non distended, no guarding  MSK: No cyanosis or clubbing- no pedal edema   Data Reviewed: Basic Metabolic Panel:  Recent Labs Lab 12/31/15 1131 12/31/15 1418 12/31/15 1834 01/01/16 0409 01/02/16 0332  NA 135 137  --  139 139  K 4.4 3.6  --  3.3* 3.7  CL 102 107  --  107 109  CO2 21 19*  --  22 20*  GLUCOSE 195* 213*  --  120* 121*  BUN 13 15  --  11 7  CREATININE 0.71 0.63 0.71 0.66 0.68  CALCIUM 10.4 10.3  --  9.4 9.3  MG  --   --   --   --  1.5*   Liver Function Tests:  Recent Labs Lab 12/31/15 1131 12/31/15 1418 01/01/16 0409  AST 21 24 17   ALT 18 21 16   ALKPHOS 146* 144* 117  BILITOT 1.4* 2.0* 1.1  PROT 7.0 7.8 6.4*  ALBUMIN 4.4 4.5 3.7    Recent Labs Lab 12/31/15 1131 12/31/15 1418 01/01/16 0409 01/02/16 0332  LIPASE 362* 306* 177* 56*  AMYLASE 169*  --   --   --    No results for input(s): AMMONIA in the last 168 hours. CBC:  Recent Labs Lab 12/31/15 1209 12/31/15 1418  01/01/16 0409  WBC 9.7 9.3 7.8  NEUTROABS  --  7.4  --   HGB 15.4 14.8 13.1  HCT 43.3 45.4 40.7  MCV 83.0 84.7 85.9  PLT  --  244 163   Cardiac Enzymes: No results for input(s): CKTOTAL, CKMB, CKMBINDEX, TROPONINI in the last 168 hours. BNP (last 3 results) No results for input(s): BNP in the last 8760 hours.  ProBNP (last 3 results) No results for input(s): PROBNP in the last 8760 hours.  CBG:  Recent Labs Lab 01/01/16 2023 01/02/16 0004 01/02/16 0433 01/02/16 0728 01/02/16 1132  GLUCAP 104* 119* 108* 108* 177*    No results found for this or any previous visit (from the past 240 hour(s)).   Studies: No results found.  Scheduled Meds:  Scheduled Meds: . buPROPion  150 mg Oral BID  . enoxaparin (LOVENOX) injection  40 mg Subcutaneous Q24H  . FLUoxetine  60 mg Oral q morning - 34Y  . folic acid  1 mg Oral Daily  . insulin aspart  0-5 Units Subcutaneous QHS  . insulin aspart  0-9 Units Subcutaneous TID WC  . insulin glargine  6 Units Subcutaneous QHS  . labetalol  20 mg Intravenous Q6H  . lisinopril  40 mg Oral Daily  . multivitamin with minerals  1 tablet Oral Daily  . pantoprazole (PROTONIX) IV  40 mg Intravenous Q24H  . thiamine  100 mg Oral Daily   Or  . thiamine  100 mg Intravenous Daily   Continuous Infusions: . sodium chloride 125 mL/hr at 01/02/16 3709    Time spent on care of this patient: 27 min   [place], MD 01/02/2016, 5:43 PM  LOS: 2 days   [facility] Office  [phone] Pager - Text Page per [email or web address] If 7PM-7AM, please contact night-coverage [email or web address]

## 2016-01-03 DIAGNOSIS — E119 Type 2 diabetes mellitus without complications: Secondary | ICD-10-CM

## 2016-01-03 DIAGNOSIS — K859 Acute pancreatitis without necrosis or infection, unspecified: Secondary | ICD-10-CM | POA: Insufficient documentation

## 2016-01-03 DIAGNOSIS — I10 Essential (primary) hypertension: Secondary | ICD-10-CM

## 2016-01-03 DIAGNOSIS — K852 Alcohol induced acute pancreatitis without necrosis or infection: Secondary | ICD-10-CM

## 2016-01-03 DIAGNOSIS — I160 Hypertensive urgency: Secondary | ICD-10-CM

## 2016-01-03 DIAGNOSIS — F1010 Alcohol abuse, uncomplicated: Secondary | ICD-10-CM | POA: Insufficient documentation

## 2016-01-03 LAB — GLUCOSE, CAPILLARY
GLUCOSE-CAPILLARY: 125 mg/dL — AB (ref 65–99)
GLUCOSE-CAPILLARY: 138 mg/dL — AB (ref 65–99)
Glucose-Capillary: 110 mg/dL — ABNORMAL HIGH (ref 65–99)
Glucose-Capillary: 121 mg/dL — ABNORMAL HIGH (ref 65–99)
Glucose-Capillary: 146 mg/dL — ABNORMAL HIGH (ref 65–99)

## 2016-01-03 MED ORDER — METOPROLOL TARTRATE 50 MG PO TABS
100.0000 mg | ORAL_TABLET | Freq: Two times a day (BID) | ORAL | Status: DC
  Administered 2016-01-03 – 2016-01-04 (×2): 100 mg via ORAL
  Filled 2016-01-03 (×2): qty 2

## 2016-01-03 NOTE — Discharge Summary (Signed)
Physician Discharge Summary  [name] EUM:[id number] DOB: [birth year] DOA: 12/31/2015  PCP: Pcp Not In System  Admit date: 12/31/2015 Discharge date: 01/04/16 Recommendations for Outpatient Follow-up:  1. Pt will need to follow up with PCP in 2 weeks post discharge 2. Please obtain BMP and CBC in one week  Discharge Diagnoses:  Acute pancreatitis - secondary to ETOH abuse -Triglycerides normal- no gallstones noted on CT scan- Tbili and Alk phos were initially elevated but have normalized - advance to soft diet which pt tolerated -continued IVF -am CMP  Active Problems:  Hypertensive urgency - change antiHTN meds to po -continue metoprolol and lisinopril -Metoprolol tartrate increased to 100 mg twice a day -Follow up with primary care provider to further adjust antihypertensive regimen  ETOH abuse - last drink 6 days prior to admission - no signs of withdrawal  Hepatic steatosis - either due to ETOH use or NASH or both   DM type 2 goal A1C below 7.0 - sugars controlled on low dose Lantus and insulin sliding scale Q 4 hrs - Metformin is being held - recheck A1C--pending at the time of discharge   Depression - cont Welbutrin and Prozac  Discharge Condition: stable  Disposition: home  Diet:carb modified Wt Readings from Last 3 Encounters:  12/31/15 84.913 kg (187 lb 3.2 oz)  12/31/15 88.451 kg (195 lb)    History of present illness:  Alyssa Garcia is a 55 y.o. female female with DM, depression, HTN presents with 2 days of mid abdominal pain, aching in nature, 8/10 with vomiting. No fevers and no diarrhea. She is found by blood work and imaging to have acute pancreatitis. She binge drinks and last drank about 5 days prior to admission. No h/o gallstones or hypertriglyceridemia. No h/o pancreatitis in the past. CT of the abdomen and pelvis was negative for pancreatic abscess, pseudocyst, or necrosis. There was no cholelithiasis noted, nor was there any ductal  dilatation. The patient was treated with bowel rest and intravenous fluids. She improved clinically. Her diet was gradually advanced which she tolerated. The patient's blood pressure regimen was adjusted.  Discharge Exam: Filed Vitals:   01/03/16 2104 01/04/16 0509  BP: 168/93 164/100  Pulse: 84 84  Temp: 98.2 F (36.8 C) 98 F (36.7 C)  Resp: 18 18   Filed Vitals:   01/03/16 0453 01/03/16 1501 01/03/16 2104 01/04/16 0509  BP: 155/87 173/103 168/93 164/100  Pulse: 84 87 84 84  Temp: 98.6 F (37 C) 98.6 F (37 C) 98.2 F (36.8 C) 98 F (36.7 C)  TempSrc: Oral Oral Oral Oral  Resp: 20 18 18 18   Height:      Weight:      SpO2: 100% 100% 100% 100%   General: A&O x 3, NAD, pleasant, cooperative Cardiovascular: RRR, no rub, no gallop, no S3 Respiratory: CTAB, no wheeze, no rhonchi Abdomen:soft, nontender, nondistended, positive bowel sounds Extremities: No edema, No lymphangitis, no petechiae  Discharge Instructions      Discharge Instructions    Diet - low sodium heart healthy    Complete by:  As directed      Increase activity slowly    Complete by:  As directed             Medication List    STOP taking these medications        ibuprofen 200 MG tablet  Commonly known as:  ADVIL,MOTRIN      TAKE these medications  buPROPion 150 MG 12 hr tablet  Commonly known as:  WELLBUTRIN SR  Take 150 mg by mouth 2 (two) times daily.     FLUoxetine 20 MG capsule  Commonly known as:  PROZAC  Take 60 mg by mouth every morning.     LANTUS SOLOSTAR 100 UNIT/ML Solostar Pen  Generic drug:  Insulin Glargine  Inject 10-12 Units into the skin 2 (two) times daily. 12 units every morning and 10 every night     lisinopril 40 MG tablet  Commonly known as:  PRINIVIL,ZESTRIL  Take 40 mg by mouth daily.     metFORMIN 1000 MG tablet  Commonly known as:  GLUCOPHAGE  Take 1,000 mg by mouth 2 (two) times daily with a meal.     metoprolol 100 MG tablet  Commonly known as:   LOPRESSOR  Take 1 tablet (100 mg total) by mouth 2 (two) times daily.     omeprazole 20 MG tablet  Commonly known as:  PRILOSEC OTC  Take 20 mg by mouth daily.         The results of significant diagnostics from this hospitalization (including imaging, microbiology, ancillary and laboratory) are listed below for reference.    Significant Diagnostic Studies: Ct Abdomen Pelvis W Contrast  12/31/2015  CLINICAL DATA:  Mid abdominal pain with nausea vomiting and diarrhea. Elevated serum lipase level EXAM: CT ABDOMEN AND PELVIS WITH CONTRAST TECHNIQUE: Multidetector CT imaging of the abdomen and pelvis was performed using the standard protocol following bolus administration of intravenous contrast. CONTRAST:  159m OMNIPAQUE IOHEXOL 300 MG/ML SOLN, 226mOMNIPAQUE IOHEXOL 300 MG/ML SOLN COMPARISON:  12/31/2015 ; report dated 02/06/2000 FINDINGS: Lower chest:  Mild cardiomegaly. Hepatobiliary: Mild diffuse hepatic steatosis. Pancreas: Diffuse peripancreatic stranding compatible with acute pancreatitis. No findings of necrosis, abscess, or pseudocyst. Spleen: Unremarkable Adrenals/Urinary Tract: Unremarkable Stomach/Bowel: Unremarkable Vascular/Lymphatic: Upper peripancreatic lymph node 1.5 cm in short axis on image 22 series 2. Portacaval node 1.3 cm in short axis, image 24 series 2. Porta hepatis node 1.3 cm in short axis, image 27 series 2. These lymph nodes are likely reactive. Reproductive: Unremarkable Other: Small amount of perihepatic and perisplenic ascites. Trace fluid in the paracolic gutters. Musculoskeletal: Lower lumbar spondylosis and degenerative disc disease. A transitional lumbosacral vertebra is assumed to represent the S1 level. Careful correlation with this numbering strategy prior to any procedural intervention would be recommended. There is potentially prominent central narrowing of the thecal sac at the L4-5 level especially eccentric to the left due to disc bulge and disc protrusion  along with at least mild left foraminal stenosis at this level. There is also intervertebral spurring at the L5-S1 level. Infraumbilical hernia contains adipose tissue. IMPRESSION: 1. Acute pancreatitis. No pancreatic abscess, pseudocyst, or necrosis. Peripancreatic stranding observed with a small amount of perihepatic and perisplenic ascites. Surrounding porta hepatis reactive lymph nodes. 2. Lumbar spondylosis and degenerative disc disease potentially with considerable impingement at the L4-5 level (please note that S1 is transitional). 3. Infraumbilical hernia contains adipose tissue. 4. Mild cardiomegaly. 5. Mild diffuse hepatic steatosis. Electronically Signed   By: WaVan Clines.D.   On: 12/31/2015 17:04   Dg Abd Acute W/chest  12/31/2015  CLINICAL DATA:  Patient with diffuse abdominal pain, nausea and vomiting. EXAM: DG ABDOMEN ACUTE W/ 1V CHEST COMPARISON:  None. FINDINGS: Normal cardiac and mediastinal contours. No consolidative pulmonary opacities. No pleural effusion or pneumothorax. Gas is demonstrated within nondilated loops of large and small bowel in a nonobstructed pattern. No free  intraperitoneal air. Unremarkable osseous skeleton. IMPRESSION: No acute cardiopulmonary process. Nonobstructed bowel gas pattern. Electronically Signed   By: Lovey Newcomer M.D.   On: 12/31/2015 12:05     Microbiology: No results found for this or any previous visit (from the past 240 hour(s)).   Labs: Basic Metabolic Panel:  Recent Labs Lab 12/31/15 1131 12/31/15 1418 12/31/15 1834 01/01/16 0409 01/02/16 0332 01/04/16 0355  NA 135 137  --  139 139 143  K 4.4 3.6  --  3.3* 3.7 4.1  CL 102 107  --  107 109 114*  CO2 21 19*  --  22 20* 22  GLUCOSE 195* 213*  --  120* 121* 135*  BUN 13 15  --  11 7 <5*  CREATININE 0.71 0.63 0.71 0.66 0.68 0.60  CALCIUM 10.4 10.3  --  9.4 9.3 9.3  MG  --   --   --   --  1.5*  --    Liver Function Tests:  Recent Labs Lab 12/31/15 1131 12/31/15 1418  01/01/16 0409 01/04/16 0355  AST 21 24 17 22   ALT 18 21 16 16   ALKPHOS 146* 144* 117 131*  BILITOT 1.4* 2.0* 1.1 0.6  PROT 7.0 7.8 6.4* 6.1*  ALBUMIN 4.4 4.5 3.7 3.3*    Recent Labs Lab 12/31/15 1131 12/31/15 1418 01/01/16 0409 01/02/16 0332  LIPASE 362* 306* 177* 56*  AMYLASE 169*  --   --   --    No results for input(s): AMMONIA in the last 168 hours. CBC:  Recent Labs Lab 12/31/15 1209 12/31/15 1418 01/01/16 0409 01/04/16 0355  WBC 9.7 9.3 7.8 5.3  NEUTROABS  --  7.4  --   --   HGB 15.4 14.8 13.1 11.5*  HCT 43.3 45.4 40.7 35.7*  MCV 83.0 84.7 85.9 86.4  PLT  --  244 163 144*   Cardiac Enzymes: No results for input(s): CKTOTAL, CKMB, CKMBINDEX, TROPONINI in the last 168 hours. BNP: Invalid input(s): POCBNP CBG:  Recent Labs Lab 01/03/16 0724 01/03/16 1158 01/03/16 1559 01/03/16 2050 01/04/16 0739  GLUCAP 125* 138* 121* 146* 139*    Time coordinating discharge:  Greater than 30 minutes  Signed:  Lavell Supple, DO [facility] Pager: [phone] 01/04/2016, 11:23 AM

## 2016-01-03 NOTE — Progress Notes (Signed)
PROGRESS NOTE  Alyssa Garcia VZS:[id number] DOB: 1960-11-22 DOA: 12/31/2015 PCP: Pcp Not In System  Brief narrative: Alyssa Garcia is a 55 y.o. female female with DM, depression, HTN presents with 2 days of mid abdominal pain, aching in nature, 8/10 with vomiting. No fevers and no diarrhea. She is found by blood work and imaging to have acute pancreatitis. She binge drinks and last drank about 5 days ago. No h/o gallstones or hypertriglyceridemia. No h/o pancreatitis in the past.    Subjective: No longer having abdominal pain or nausea. No fever, vomiting or diarrhea. Denies fevers, chills, chest pain, dysuria, hematuria.  Assessment/Plan: Acute pancreatitis - secondary to ETOH abuse -Triglycerides normal- no gallstones noted on CT scan- Tbili and Alk phos were initially elevated but have normalized - advance to full liquids -continue IVF -am CMP  Active Problems:  Hypertensive urgency - change antiHTN meds to po -continue metoprolol and lisinopril  ETOH abuse - last drink 6 days prior to admission - no signs of withdrawal  Hepatic steatosis - either due to ETOH use or NASH or both   DM type 2 goal A1C below 7.0 - sugars controlled on low dose Lantus and insulin sliding scale Q 4 hrs - Metformin is being held - recheck A1C   Depression - cont Welbutrin and Prozac    Procedures/Studies: Ct Abdomen Pelvis W Contrast  12/31/2015  CLINICAL DATA:  Mid abdominal pain with nausea vomiting and diarrhea. Elevated serum lipase level EXAM: CT ABDOMEN AND PELVIS WITH CONTRAST TECHNIQUE: Multidetector CT imaging of the abdomen and pelvis was performed using the standard protocol following bolus administration of intravenous contrast. CONTRAST:  18m OMNIPAQUE IOHEXOL 300 MG/ML SOLN, 231mOMNIPAQUE IOHEXOL 300 MG/ML SOLN COMPARISON:  12/31/2015 ; report dated 02/06/2000 FINDINGS: Lower chest:  Mild cardiomegaly. Hepatobiliary: Mild diffuse hepatic steatosis. Pancreas:  Diffuse peripancreatic stranding compatible with acute pancreatitis. No findings of necrosis, abscess, or pseudocyst. Spleen: Unremarkable Adrenals/Urinary Tract: Unremarkable Stomach/Bowel: Unremarkable Vascular/Lymphatic: Upper peripancreatic lymph node 1.5 cm in short axis on image 22 series 2. Portacaval node 1.3 cm in short axis, image 24 series 2. Porta hepatis node 1.3 cm in short axis, image 27 series 2. These lymph nodes are likely reactive. Reproductive: Unremarkable Other: Small amount of perihepatic and perisplenic ascites. Trace fluid in the paracolic gutters. Musculoskeletal: Lower lumbar spondylosis and degenerative disc disease. A transitional lumbosacral vertebra is assumed to represent the S1 level. Careful correlation with this numbering strategy prior to any procedural intervention would be recommended. There is potentially prominent central narrowing of the thecal sac at the L4-5 level especially eccentric to the left due to disc bulge and disc protrusion along with at least mild left foraminal stenosis at this level. There is also intervertebral spurring at the L5-S1 level. Infraumbilical hernia contains adipose tissue. IMPRESSION: 1. Acute pancreatitis. No pancreatic abscess, pseudocyst, or necrosis. Peripancreatic stranding observed with a small amount of perihepatic and perisplenic ascites. Surrounding porta hepatis reactive lymph nodes. 2. Lumbar spondylosis and degenerative disc disease potentially with considerable impingement at the L4-5 level (please note that S1 is transitional). 3. Infraumbilical hernia contains adipose tissue. 4. Mild cardiomegaly. 5. Mild diffuse hepatic steatosis. Electronically Signed   By: WaVan Clines.D.   On: 12/31/2015 17:04   Dg Abd Acute W/chest  12/31/2015  CLINICAL DATA:  Patient with diffuse abdominal pain, nausea and vomiting. EXAM: DG ABDOMEN ACUTE W/ 1V CHEST COMPARISON:  None. FINDINGS: Normal cardiac  and mediastinal contours. No  consolidative pulmonary opacities. No pleural effusion or pneumothorax. Gas is demonstrated within nondilated loops of large and small bowel in a nonobstructed pattern. No free intraperitoneal air. Unremarkable osseous skeleton. IMPRESSION: No acute cardiopulmonary process. Nonobstructed bowel gas pattern. Electronically Signed   By: Lovey Newcomer M.D.   On: 12/31/2015 12:05            Objective: Filed Vitals:   01/02/16 2210 01/03/16 0211 01/03/16 0453 01/03/16 1501  BP: 172/97 185/101 155/87 173/103  Pulse: 86  84 87  Temp: 99.1 F (37.3 C)  98.6 F (37 C) 98.6 F (37 C)  TempSrc: Oral  Oral Oral  Resp: _0 Height:      Weight:      SpO2: 100%  100% 100%    Intake/Output Summary (Last 24 hours) at 01/03/16 1719 Last data filed at 01/03/16 1501  Gross per 24 hour  Intake 4894.58 ml  Output    600 ml  Net 4294.58 ml   Weight change:  Exam:   General:  Pt is alert, follows commands appropriately, not in acute distress  HEENT: No icterus, No thrush, No neck mass, [place]/AT  Cardiovascular: RRR, S1/S2, no rubs, no gallops  Respiratory: CTA bilaterally, no wheezing, no crackles, no rhonchi  Abdomen: Soft/+BS, non tender, non distended, no guarding  Extremities: No edema, No lymphangitis, No petechiae, No rashes, no synovitis  Data Reviewed: Basic Metabolic Panel:  Recent Labs Lab 12/31/15 1131 12/31/15 1418 12/31/15 1834 01/01/16 0409 01/02/16 0332  NA 135 137  --  139 139  K 4.4 3.6  --  3.3* 3.7  CL 102 107  --  107 109  CO2 21 19*  --  22 20*  GLUCOSE 195* 213*  --  120* 121*  BUN 13 15  --  11 7  CREATININE 0.71 0.63 0.71 0.66 0.68  CALCIUM 10.4 10.3  --  9.4 9.3  MG  --   --   --   --  1.5*   Liver Function Tests:  Recent Labs Lab 12/31/15 1131 12/31/15 1418 01/01/16 0409  AST _1 ALT _2 ALKPHOS 146* 144* 117  BILITOT 1.4* 2.0* 1.1  PROT 7.0 7.8 6.4*  ALBUMIN 4.4 4.5 3.7    Recent Labs Lab 12/31/15 1131  12/31/15 1418 01/01/16 0409 01/02/16 0332  LIPASE 362* 306* 177* 56*  AMYLASE 169*  --   --   --    No results for input(s): AMMONIA in the last 168 hours. CBC:  Recent Labs Lab 12/31/15 1209 12/31/15 1418 01/01/16 0409  WBC 9.7 9.3 7.8  NEUTROABS  --  7.4  --   HGB 15.4 14.8 13.1  HCT 43.3 45.4 40.7  MCV 83.0 84.7 85.9  PLT  --  244 163   Cardiac Enzymes: No results for input(s): CKTOTAL, CKMB, CKMBINDEX, TROPONINI in the last 168 hours. BNP: Invalid input(s): POCBNP CBG:  Recent Labs Lab 01/02/16 1726 01/02/16 2215 01/03/16 0724 01/03/16 1158 01/03/16 1559  GLUCAP 152* 110* 125* 138* 121*    No results found for this or any previous visit (from the past 240 hour(s)).   Scheduled Meds: . buPROPion  150 mg Oral BID  . enoxaparin (LOVENOX) injection  40 mg Subcutaneous Q24H  . FLUoxetine  60 mg Oral q morning - 15B  . folic acid  1 mg Oral Daily  . insulin aspart  0-5 Units Subcutaneous QHS  . insulin  aspart  0-9 Units Subcutaneous TID WC  . insulin glargine  6 Units Subcutaneous QHS  . labetalol  20 mg Intravenous Q6H  . lisinopril  40 mg Oral Daily  . multivitamin with minerals  1 tablet Oral Daily  . pantoprazole (PROTONIX) IV  40 mg Intravenous Q24H  . thiamine  100 mg Oral Daily   Or  . thiamine  100 mg Intravenous Daily   Continuous Infusions: . sodium chloride 125 mL/hr at 01/03/16 0223     Yash Cacciola, DO  [facility] Pager [phone]  If 7PM-7AM, please contact night-coverage [email or web address] Password [email or web address] 01/03/2016, 5:19 PM   LOS: 3 days

## 2016-01-04 LAB — COMPREHENSIVE METABOLIC PANEL
ALT: 16 U/L (ref 14–54)
AST: 22 U/L (ref 15–41)
Albumin: 3.3 g/dL — ABNORMAL LOW (ref 3.5–5.0)
Alkaline Phosphatase: 131 U/L — ABNORMAL HIGH (ref 38–126)
Anion gap: 7 (ref 5–15)
CHLORIDE: 114 mmol/L — AB (ref 101–111)
CO2: 22 mmol/L (ref 22–32)
Calcium: 9.3 mg/dL (ref 8.9–10.3)
Creatinine, Ser: 0.6 mg/dL (ref 0.44–1.00)
GFR calc Af Amer: >60 mL/min (ref >60)
Glucose, Bld: 135 mg/dL — ABNORMAL HIGH (ref 65–99)
POTASSIUM: 4.1 mmol/L (ref 3.5–5.1)
Sodium: 143 mmol/L (ref 135–145)
Total Bilirubin: 0.6 mg/dL (ref 0.3–1.2)
Total Protein: 6.1 g/dL — ABNORMAL LOW (ref 6.5–8.1)

## 2016-01-04 LAB — CBC
HEMATOCRIT: 35.7 % — AB (ref 36.0–46.0)
Hemoglobin: 11.5 g/dL — ABNORMAL LOW (ref 12.0–15.0)
MCH: 27.8 pg (ref 26.0–34.0)
MCHC: 32.2 g/dL (ref 30.0–36.0)
MCV: 86.4 fL (ref 78.0–100.0)
Platelets: 144 10*3/uL — ABNORMAL LOW (ref 150–400)
RBC: 4.13 MIL/uL (ref 3.87–5.11)
RDW: 13.8 % (ref 11.5–15.5)
WBC: 5.3 10*3/uL (ref 4.0–10.5)

## 2016-01-04 LAB — GLUCOSE, CAPILLARY: GLUCOSE-CAPILLARY: 139 mg/dL — AB (ref 65–99)

## 2016-01-04 MED ORDER — METOPROLOL TARTRATE 100 MG PO TABS: 100.0000 mg | ORAL_TABLET | Freq: Two times a day (BID) | ORAL | Status: AC

## 2016-01-05 LAB — HEMOGLOBIN A1C
Hgb A1c MFr Bld: 6.7 % — ABNORMAL HIGH (ref 4.8–5.6)
MEAN PLASMA GLUCOSE: 146 mg/dL

## 2016-01-21 ENCOUNTER — Inpatient Hospital Stay (HOSPITAL_COMMUNITY): Payer: BLUE CROSS/BLUE SHIELD

## 2016-01-21 ENCOUNTER — Encounter (HOSPITAL_COMMUNITY): Payer: Self-pay | Admitting: *Deleted

## 2016-01-21 ENCOUNTER — Inpatient Hospital Stay (HOSPITAL_COMMUNITY)
Admission: EM | Admit: 2016-01-21 | Discharge: 2016-02-03 | DRG: 308 | Disposition: E | Payer: BLUE CROSS/BLUE SHIELD | Attending: Pulmonary Disease | Admitting: Pulmonary Disease

## 2016-01-21 ENCOUNTER — Emergency Department (HOSPITAL_COMMUNITY): Payer: BLUE CROSS/BLUE SHIELD

## 2016-01-21 ENCOUNTER — Other Ambulatory Visit (HOSPITAL_COMMUNITY): Payer: BLUE CROSS/BLUE SHIELD

## 2016-01-21 DIAGNOSIS — I429 Cardiomyopathy, unspecified: Secondary | ICD-10-CM

## 2016-01-21 DIAGNOSIS — R001 Bradycardia, unspecified: Secondary | ICD-10-CM | POA: Diagnosis present

## 2016-01-21 DIAGNOSIS — I469 Cardiac arrest, cause unspecified: Secondary | ICD-10-CM

## 2016-01-21 DIAGNOSIS — F29 Unspecified psychosis not due to a substance or known physiological condition: Secondary | ICD-10-CM | POA: Diagnosis present

## 2016-01-21 DIAGNOSIS — J9601 Acute respiratory failure with hypoxia: Secondary | ICD-10-CM | POA: Diagnosis not present

## 2016-01-21 DIAGNOSIS — J690 Pneumonitis due to inhalation of food and vomit: Secondary | ICD-10-CM | POA: Diagnosis present

## 2016-01-21 DIAGNOSIS — Z452 Encounter for adjustment and management of vascular access device: Secondary | ICD-10-CM

## 2016-01-21 DIAGNOSIS — Z515 Encounter for palliative care: Secondary | ICD-10-CM | POA: Diagnosis present

## 2016-01-21 DIAGNOSIS — I10 Essential (primary) hypertension: Secondary | ICD-10-CM | POA: Diagnosis present

## 2016-01-21 DIAGNOSIS — R509 Fever, unspecified: Secondary | ICD-10-CM | POA: Diagnosis present

## 2016-01-21 DIAGNOSIS — R570 Cardiogenic shock: Secondary | ICD-10-CM | POA: Diagnosis not present

## 2016-01-21 DIAGNOSIS — G931 Anoxic brain damage, not elsewhere classified: Secondary | ICD-10-CM | POA: Diagnosis not present

## 2016-01-21 DIAGNOSIS — I420 Dilated cardiomyopathy: Secondary | ICD-10-CM | POA: Diagnosis not present

## 2016-01-21 DIAGNOSIS — F1010 Alcohol abuse, uncomplicated: Secondary | ICD-10-CM | POA: Diagnosis present

## 2016-01-21 DIAGNOSIS — I4901 Ventricular fibrillation: Secondary | ICD-10-CM | POA: Diagnosis present

## 2016-01-21 DIAGNOSIS — Z9289 Personal history of other medical treatment: Secondary | ICD-10-CM

## 2016-01-21 DIAGNOSIS — E785 Hyperlipidemia, unspecified: Secondary | ICD-10-CM | POA: Diagnosis present

## 2016-01-21 DIAGNOSIS — R4182 Altered mental status, unspecified: Secondary | ICD-10-CM | POA: Diagnosis not present

## 2016-01-21 DIAGNOSIS — R569 Unspecified convulsions: Secondary | ICD-10-CM

## 2016-01-21 DIAGNOSIS — I255 Ischemic cardiomyopathy: Secondary | ICD-10-CM | POA: Diagnosis present

## 2016-01-21 DIAGNOSIS — Z794 Long term (current) use of insulin: Secondary | ICD-10-CM

## 2016-01-21 DIAGNOSIS — Z6832 Body mass index (BMI) 32.0-32.9, adult: Secondary | ICD-10-CM

## 2016-01-21 DIAGNOSIS — I462 Cardiac arrest due to underlying cardiac condition: Secondary | ICD-10-CM | POA: Diagnosis present

## 2016-01-21 DIAGNOSIS — E1165 Type 2 diabetes mellitus with hyperglycemia: Secondary | ICD-10-CM | POA: Diagnosis present

## 2016-01-21 DIAGNOSIS — E669 Obesity, unspecified: Secondary | ICD-10-CM | POA: Diagnosis present

## 2016-01-21 DIAGNOSIS — Z79899 Other long term (current) drug therapy: Secondary | ICD-10-CM

## 2016-01-21 DIAGNOSIS — E872 Acidosis: Secondary | ICD-10-CM | POA: Diagnosis present

## 2016-01-21 DIAGNOSIS — R7989 Other specified abnormal findings of blood chemistry: Secondary | ICD-10-CM

## 2016-01-21 DIAGNOSIS — F329 Major depressive disorder, single episode, unspecified: Secondary | ICD-10-CM | POA: Diagnosis present

## 2016-01-21 DIAGNOSIS — I426 Alcoholic cardiomyopathy: Secondary | ICD-10-CM | POA: Diagnosis present

## 2016-01-21 DIAGNOSIS — E876 Hypokalemia: Secondary | ICD-10-CM | POA: Diagnosis present

## 2016-01-21 DIAGNOSIS — I160 Hypertensive urgency: Secondary | ICD-10-CM | POA: Diagnosis present

## 2016-01-21 DIAGNOSIS — R778 Other specified abnormalities of plasma proteins: Secondary | ICD-10-CM

## 2016-01-21 DIAGNOSIS — J9690 Respiratory failure, unspecified, unspecified whether with hypoxia or hypercapnia: Secondary | ICD-10-CM | POA: Insufficient documentation

## 2016-01-21 HISTORY — DX: Acute pancreatitis without necrosis or infection, unspecified: K85.90

## 2016-01-21 LAB — POCT I-STAT 3, ART BLOOD GAS (G3+)
ACID-BASE DEFICIT: 9 mmol/L — AB (ref 0.0–2.0)
Bicarbonate: 20.5 mEq/L (ref 20.0–24.0)
O2 SAT: 100 %
Patient temperature: 34.9
TCO2: 22 mmol/L (ref 0–100)
pCO2 arterial: 52.3 mmHg — ABNORMAL HIGH (ref 35.0–45.0)
pH, Arterial: 7.188 — CL (ref 7.350–7.450)
pO2, Arterial: 219 mmHg — ABNORMAL HIGH (ref 80.0–100.0)

## 2016-01-21 LAB — LACTIC ACID, PLASMA
LACTIC ACID, VENOUS: 1 mmol/L (ref 0.5–2.0)
Lactic Acid, Venous: 1.6 mmol/L (ref 0.5–2.0)
Lactic Acid, Venous: 0.6 mmol/L (ref 0.5–2.0)

## 2016-01-21 LAB — BLOOD GAS, ARTERIAL
ACID-BASE DEFICIT: 10 mmol/L — AB (ref 0.0–2.0)
BICARBONATE: 14.6 meq/L — AB (ref 20.0–24.0)
Drawn by: 257701
FIO2: 0.45
LHR: 28 {breaths}/min
O2 SAT: 99.1 %
PATIENT TEMPERATURE: 90.2
PCO2 ART: 22.2 mmHg — AB (ref 35.0–45.0)
PEEP/CPAP: 5 cmH2O
PH ART: 7.407 (ref 7.350–7.450)
PO2 ART: 145 mmHg — AB (ref 80.0–100.0)
TCO2: 15.4 mmol/L (ref 0–100)
VT: 450 mL

## 2016-01-21 LAB — CBC WITH DIFFERENTIAL/PLATELET
Basophils Absolute: 0 10*3/uL (ref 0.0–0.1)
Basophils Relative: 0 %
EOS ABS: 0 10*3/uL (ref 0.0–0.7)
Eosinophils Relative: 0 %
HEMATOCRIT: 41.1 % (ref 36.0–46.0)
HEMOGLOBIN: 12.8 g/dL (ref 12.0–15.0)
LYMPHS ABS: 1 10*3/uL (ref 0.7–4.0)
LYMPHS PCT: 8 %
MCH: 26.7 pg (ref 26.0–34.0)
MCHC: 31.1 g/dL (ref 30.0–36.0)
MCV: 85.6 fL (ref 78.0–100.0)
MONOS PCT: 11 %
Monocytes Absolute: 1.3 10*3/uL — ABNORMAL HIGH (ref 0.1–1.0)
NEUTROS ABS: 10 10*3/uL — AB (ref 1.7–7.7)
NEUTROS PCT: 81 %
Platelets: 195 10*3/uL (ref 150–400)
RBC: 4.8 MIL/uL (ref 3.87–5.11)
RDW: 14.6 % (ref 11.5–15.5)
WBC: 12.3 10*3/uL — AB (ref 4.0–10.5)

## 2016-01-21 LAB — I-STAT TROPONIN, ED: Troponin i, poc: 0 ng/mL (ref 0.00–0.08)

## 2016-01-21 LAB — PHOSPHORUS: Phosphorus: 3.7 mg/dL (ref 2.5–4.6)

## 2016-01-21 LAB — MAGNESIUM: MAGNESIUM: 1.4 mg/dL — AB (ref 1.7–2.4)

## 2016-01-21 LAB — BASIC METABOLIC PANEL
ANION GAP: 13 (ref 5–15)
ANION GAP: 17 — AB (ref 5–15)
Anion gap: 12 (ref 5–15)
Anion gap: 12 (ref 5–15)
BUN: 12 mg/dL (ref 6–20)
BUN: 7 mg/dL (ref 6–20)
BUN: 10 mg/dL (ref 6–20)
BUN: 11 mg/dL (ref 6–20)
CALCIUM: 8.3 mg/dL — AB (ref 8.9–10.3)
CHLORIDE: 110 mmol/L (ref 101–111)
CHLORIDE: 112 mmol/L — AB (ref 101–111)
CHLORIDE: 108 mmol/L (ref 101–111)
CO2: 19 mmol/L — AB (ref 22–32)
CO2: 18 mmol/L — AB (ref 22–32)
CO2: 15 mmol/L — ABNORMAL LOW (ref 22–32)
CO2: 14 mmol/L — ABNORMAL LOW (ref 22–32)
CREATININE: 1.02 mg/dL — AB (ref 0.44–1.00)
CREATININE: 0.76 mg/dL (ref 0.44–1.00)
Calcium: 8.2 mg/dL — ABNORMAL LOW (ref 8.9–10.3)
Calcium: 8.2 mg/dL — ABNORMAL LOW (ref 8.9–10.3)
Calcium: 8.6 mg/dL — ABNORMAL LOW (ref 8.9–10.3)
Chloride: 111 mmol/L (ref 101–111)
Creatinine, Ser: 0.72 mg/dL (ref 0.44–1.00)
Creatinine, Ser: 0.76 mg/dL (ref 0.44–1.00)
GFR calc Af Amer: >60 mL/min (ref >60)
GFR calc Af Amer: >60 mL/min (ref >60)
GFR calc non Af Amer: >60 mL/min (ref >60)
GLUCOSE: 258 mg/dL — AB (ref 65–99)
GLUCOSE: 252 mg/dL — AB (ref 65–99)
Glucose, Bld: 277 mg/dL — ABNORMAL HIGH (ref 65–99)
Glucose, Bld: 293 mg/dL — ABNORMAL HIGH (ref 65–99)
POTASSIUM: 2.7 mmol/L — AB (ref 3.5–5.1)
POTASSIUM: 2.9 mmol/L — AB (ref 3.5–5.1)
POTASSIUM: 3.3 mmol/L — AB (ref 3.5–5.1)
Potassium: 2.9 mmol/L — ABNORMAL LOW (ref 3.5–5.1)
SODIUM: 139 mmol/L (ref 135–145)
SODIUM: 139 mmol/L (ref 135–145)
Sodium: 143 mmol/L (ref 135–145)
Sodium: 140 mmol/L (ref 135–145)

## 2016-01-21 LAB — URINE MICROSCOPIC-ADD ON

## 2016-01-21 LAB — COMPREHENSIVE METABOLIC PANEL
ALK PHOS: 207 U/L — AB (ref 38–126)
ALT: 33 U/L (ref 14–54)
AST: 39 U/L (ref 15–41)
Albumin: 4.4 g/dL (ref 3.5–5.0)
Anion gap: 19 — ABNORMAL HIGH (ref 5–15)
BILIRUBIN TOTAL: 1.8 mg/dL — AB (ref 0.3–1.2)
BUN: 14 mg/dL (ref 6–20)
CHLORIDE: 102 mmol/L (ref 101–111)
CO2: 16 mmol/L — ABNORMAL LOW (ref 22–32)
CREATININE: 0.85 mg/dL (ref 0.44–1.00)
Calcium: 10.3 mg/dL (ref 8.9–10.3)
GFR calc Af Amer: >60 mL/min (ref >60)
Glucose, Bld: 269 mg/dL — ABNORMAL HIGH (ref 65–99)
Potassium: 3.8 mmol/L (ref 3.5–5.1)
Sodium: 137 mmol/L (ref 135–145)
Total Protein: 7.6 g/dL (ref 6.5–8.1)

## 2016-01-21 LAB — GLUCOSE, CAPILLARY
GLUCOSE-CAPILLARY: 236 mg/dL — AB (ref 65–99)
Glucose-Capillary: 239 mg/dL — ABNORMAL HIGH (ref 65–99)
Glucose-Capillary: 209 mg/dL — ABNORMAL HIGH (ref 65–99)
Glucose-Capillary: 193 mg/dL — ABNORMAL HIGH (ref 65–99)
Glucose-Capillary: 256 mg/dL — ABNORMAL HIGH (ref 65–99)
Glucose-Capillary: 255 mg/dL — ABNORMAL HIGH (ref 65–99)
Glucose-Capillary: 253 mg/dL — ABNORMAL HIGH (ref 65–99)
Glucose-Capillary: 251 mg/dL — ABNORMAL HIGH (ref 65–99)
Glucose-Capillary: 235 mg/dL — ABNORMAL HIGH (ref 65–99)
Glucose-Capillary: 234 mg/dL — ABNORMAL HIGH (ref 65–99)

## 2016-01-21 LAB — CBG MONITORING, ED: GLUCOSE-CAPILLARY: 300 mg/dL — AB (ref 65–99)

## 2016-01-21 LAB — URINALYSIS, ROUTINE W REFLEX MICROSCOPIC
GLUCOSE, UA: 500 mg/dL — AB
LEUKOCYTES UA: NEGATIVE
Nitrite: NEGATIVE
Specific Gravity, Urine: 1.024 (ref 1.005–1.030)
pH: 7 (ref 5.0–8.0)

## 2016-01-21 LAB — PROTIME-INR
INR: 1.27 (ref 0.00–1.49)
INR: 1.22 (ref 0.00–1.49)
INR: 1.27 (ref 0.00–1.49)
PROTHROMBIN TIME: 16 s — AB (ref 11.6–15.2)
Prothrombin Time: 16.1 seconds — ABNORMAL HIGH (ref 11.6–15.2)
Prothrombin Time: 15.6 seconds — ABNORMAL HIGH (ref 11.6–15.2)

## 2016-01-21 LAB — CBC
HCT: 46.7 % — ABNORMAL HIGH (ref 36.0–46.0)
Hemoglobin: 15 g/dL (ref 12.0–15.0)
MCH: 27.4 pg (ref 26.0–34.0)
MCHC: 32.1 g/dL (ref 30.0–36.0)
MCV: 85.2 fL (ref 78.0–100.0)
PLATELETS: 262 10*3/uL (ref 150–400)
RBC: 5.48 MIL/uL — ABNORMAL HIGH (ref 3.87–5.11)
RDW: 14.4 % (ref 11.5–15.5)
WBC: 10.4 10*3/uL (ref 4.0–10.5)

## 2016-01-21 LAB — APTT
APTT: 27 s (ref 24–37)
APTT: 29 s (ref 24–37)
aPTT: 28 seconds (ref 24–37)

## 2016-01-21 LAB — MRSA PCR SCREENING: MRSA BY PCR: NEGATIVE

## 2016-01-21 LAB — D-DIMER, QUANTITATIVE (NOT AT ARMC): D DIMER QUANT: 2.42 ug{FEU}/mL — AB (ref 0.00–0.50)

## 2016-01-21 LAB — TROPONIN I
TROPONIN I: 0.13 ng/mL — AB (ref <0.031)
Troponin I: 0.42 ng/mL — ABNORMAL HIGH (ref <0.031)
Troponin I: 0.23 ng/mL — ABNORMAL HIGH (ref <0.031)

## 2016-01-21 LAB — ECHOCARDIOGRAM COMPLETE

## 2016-01-21 LAB — AMYLASE: Amylase: 61 U/L (ref 28–100)

## 2016-01-21 LAB — LIPASE, BLOOD: Lipase: 16 U/L (ref 11–51)

## 2016-01-21 LAB — PROCALCITONIN: Procalcitonin: <0.1 ng/mL

## 2016-01-21 MED ORDER — SODIUM CHLORIDE 0.9 % IV BOLUS (SEPSIS)
1000.0000 mL | Freq: Once | INTRAVENOUS | Status: AC
  Administered 2016-01-21: 1000 mL via INTRAVENOUS

## 2016-01-21 MED ORDER — NALOXONE HCL 2 MG/2ML IJ SOSY
PREFILLED_SYRINGE | INTRAMUSCULAR | Status: AC
  Administered 2016-01-21: 2 mg
  Filled 2016-01-21: qty 2

## 2016-01-21 MED ORDER — HEPARIN SODIUM (PORCINE) 5000 UNIT/ML IJ SOLN
5000.0000 [IU] | Freq: Three times a day (TID) | INTRAMUSCULAR | Status: DC
  Administered 2016-01-21 – 2016-01-26 (×15): 5000 [IU] via SUBCUTANEOUS
  Filled 2016-01-21 (×15): qty 1

## 2016-01-21 MED ORDER — SODIUM CHLORIDE 0.9 % IV SOLN
1.0000 mg/h | INTRAVENOUS | Status: DC
  Administered 2016-01-21: 5 mg/h via INTRAVENOUS
  Administered 2016-01-21: 1 mg/h via INTRAVENOUS
  Administered 2016-01-22 – 2016-01-23 (×3): 5 mg/h via INTRAVENOUS
  Filled 2016-01-21 (×5): qty 10

## 2016-01-21 MED ORDER — SODIUM CHLORIDE 0.9 % IV SOLN
25.0000 ug/h | INTRAVENOUS | Status: DC
  Administered 2016-01-21 – 2016-01-23 (×4): 300 ug/h via INTRAVENOUS
  Filled 2016-01-21 (×4): qty 50

## 2016-01-21 MED ORDER — FENTANYL CITRATE (PF) 100 MCG/2ML IJ SOLN: 100.0000 ug | Freq: Once | INTRAMUSCULAR | Status: AC

## 2016-01-21 MED ORDER — SODIUM CHLORIDE 0.9 % IV SOLN
2000.0000 mL | Freq: Once | INTRAVENOUS | Status: AC
  Administered 2016-01-21: 2000 mL via INTRAVENOUS

## 2016-01-21 MED ORDER — CISATRACURIUM BOLUS VIA INFUSION
0.0500 mg/kg | INTRAVENOUS | Status: DC | PRN
  Filled 2016-01-21: qty 5

## 2016-01-21 MED ORDER — POTASSIUM CHLORIDE 10 MEQ/100ML IV SOLN
10.0000 meq | INTRAVENOUS | Status: AC
  Administered 2016-01-21 (×6): 10 meq via INTRAVENOUS
  Filled 2016-01-21 (×5): qty 100

## 2016-01-21 MED ORDER — CISATRACURIUM BOLUS VIA INFUSION
0.1000 mg/kg | Freq: Once | INTRAVENOUS | Status: AC
Start: 2016-01-21 — End: 2016-01-21
  Administered 2016-01-21: 8.5 mg via INTRAVENOUS
  Filled 2016-01-21: qty 9

## 2016-01-21 MED ORDER — SODIUM CHLORIDE 0.9 % IV SOLN
1.0000 ug/kg/min | INTRAVENOUS | Status: DC
  Administered 2016-01-21: 1 ug/kg/min via INTRAVENOUS
  Administered 2016-01-22: 1.5 ug/kg/min via INTRAVENOUS
  Filled 2016-01-21 (×2): qty 20

## 2016-01-21 MED ORDER — SODIUM CHLORIDE 0.9% FLUSH
10.0000 mL | INTRAVENOUS | Status: DC | PRN
  Administered 2016-01-27: 20 mL
  Filled 2016-01-21: qty 40

## 2016-01-21 MED ORDER — ASPIRIN 81 MG PO CHEW
81.0000 mg | CHEWABLE_TABLET | Freq: Every day | ORAL | Status: DC
  Administered 2016-01-22 – 2016-01-25 (×4): 81 mg
  Filled 2016-01-21 (×4): qty 1

## 2016-01-21 MED ORDER — ASPIRIN 81 MG PO CHEW: 324.0000 mg | CHEWABLE_TABLET | ORAL | Status: AC

## 2016-01-21 MED ORDER — SODIUM CHLORIDE 0.9 % IV SOLN
Freq: Once | INTRAVENOUS | Status: AC
  Administered 2016-01-21: 09:00:00 via INTRAVENOUS

## 2016-01-21 MED ORDER — PROPOFOL 1000 MG/100ML IV EMUL
5.0000 ug/kg/min | Freq: Once | INTRAVENOUS | Status: AC
  Administered 2016-01-21: 10 ug/kg/min via INTRAVENOUS
  Filled 2016-01-21: qty 100

## 2016-01-21 MED ORDER — INSULIN ASPART 100 UNIT/ML ~~LOC~~ SOLN
0.0000 [IU] | SUBCUTANEOUS | Status: DC
  Administered 2016-01-21 (×2): 3 [IU] via SUBCUTANEOUS
  Administered 2016-01-21: 5 [IU] via SUBCUTANEOUS
  Filled 2016-01-21: qty 1

## 2016-01-21 MED ORDER — PIPERACILLIN-TAZOBACTAM 3.375 G IVPB 30 MIN
3.3750 g | Freq: Once | INTRAVENOUS | Status: AC
  Administered 2016-01-21: 3.375 g via INTRAVENOUS
  Filled 2016-01-21: qty 50

## 2016-01-21 MED ORDER — MIDAZOLAM HCL 2 MG/2ML IJ SOLN
2.0000 mg | Freq: Once | INTRAMUSCULAR | Status: AC
  Administered 2016-01-21: 2 mg via INTRAVENOUS

## 2016-01-21 MED ORDER — PANTOPRAZOLE SODIUM 40 MG IV SOLR
40.0000 mg | Freq: Every day | INTRAVENOUS | Status: DC
  Administered 2016-01-21 – 2016-01-23 (×4): 40 mg via INTRAVENOUS
  Filled 2016-01-21 (×4): qty 40

## 2016-01-21 MED ORDER — MAGNESIUM SULFATE 50 % IJ SOLN
3.0000 g | Freq: Once | INTRAVENOUS | Status: AC
  Administered 2016-01-21: 3 g via INTRAVENOUS
  Filled 2016-01-21 (×2): qty 6

## 2016-01-21 MED ORDER — NOREPINEPHRINE BITARTRATE 1 MG/ML IV SOLN
0.0000 ug/min | INTRAVENOUS | Status: DC
  Administered 2016-01-22: 1 ug/min via INTRAVENOUS
  Administered 2016-01-23: 4 ug/min via INTRAVENOUS
  Filled 2016-01-21 (×2): qty 4

## 2016-01-21 MED ORDER — ARTIFICIAL TEARS OP OINT
1.0000 "application " | TOPICAL_OINTMENT | Freq: Three times a day (TID) | OPHTHALMIC | Status: DC
  Administered 2016-01-21 – 2016-01-26 (×14): 1 via OPHTHALMIC
  Filled 2016-01-21 (×3): qty 3.5

## 2016-01-21 MED ORDER — MIDAZOLAM BOLUS VIA INFUSION
2.0000 mg | INTRAVENOUS | Status: DC | PRN
  Filled 2016-01-21: qty 2

## 2016-01-21 MED ORDER — ASPIRIN 300 MG RE SUPP
300.0000 mg | RECTAL | Status: AC
  Administered 2016-01-21: 300 mg via RECTAL
  Filled 2016-01-21: qty 1

## 2016-01-21 MED ORDER — SODIUM CHLORIDE 0.9 % IV SOLN
25.0000 ug/h | INTRAVENOUS | Status: DC
  Administered 2016-01-21: 200 ug/h via INTRAVENOUS
  Administered 2016-01-21: 50 ug/h via INTRAVENOUS
  Filled 2016-01-21: qty 50

## 2016-01-21 MED ORDER — PROPOFOL 1000 MG/100ML IV EMUL: 5.0000 ug/kg/min | Freq: Once | INTRAVENOUS | Status: DC

## 2016-01-21 MED ORDER — SODIUM CHLORIDE 0.9 % IV SOLN
250.0000 mL | INTRAVENOUS | Status: DC | PRN
  Administered 2016-01-21: 20 mL via INTRAVENOUS

## 2016-01-21 MED ORDER — HEPARIN SODIUM (PORCINE) 5000 UNIT/ML IJ SOLN
5000.0000 [IU] | Freq: Three times a day (TID) | INTRAMUSCULAR | Status: DC
  Filled 2016-01-21 (×3): qty 1

## 2016-01-21 MED ORDER — PIPERACILLIN-TAZOBACTAM 3.375 G IVPB
3.3750 g | Freq: Three times a day (TID) | INTRAVENOUS | Status: DC
  Administered 2016-01-21 – 2016-01-26 (×14): 3.375 g via INTRAVENOUS
  Filled 2016-01-21 (×16): qty 50

## 2016-01-21 MED ORDER — ASPIRIN 81 MG PO CHEW: 81.0000 mg | CHEWABLE_TABLET | Freq: Every day | ORAL | Status: DC

## 2016-01-21 MED ORDER — HYDRALAZINE HCL 20 MG/ML IJ SOLN
10.0000 mg | Freq: Four times a day (QID) | INTRAMUSCULAR | Status: DC | PRN
  Administered 2016-01-21: 20 mg via INTRAVENOUS
  Administered 2016-01-24: 10 mg via INTRAVENOUS
  Administered 2016-01-24 – 2016-01-26 (×5): 20 mg via INTRAVENOUS
  Filled 2016-01-21 (×9): qty 1

## 2016-01-21 MED ORDER — CHLORHEXIDINE GLUCONATE 0.12% ORAL RINSE (MEDLINE KIT)
15.0000 mL | Freq: Two times a day (BID) | OROMUCOSAL | Status: DC
  Administered 2016-01-21 – 2016-01-26 (×11): 15 mL via OROMUCOSAL

## 2016-01-21 MED ORDER — PROPOFOL 1000 MG/100ML IV EMUL
5.0000 ug/kg/min | INTRAVENOUS | Status: DC
  Administered 2016-01-21: 80 ug/kg/min via INTRAVENOUS
  Administered 2016-01-21: 40 ug/kg/min via INTRAVENOUS
  Filled 2016-01-21 (×2): qty 100

## 2016-01-21 MED ORDER — FENTANYL BOLUS VIA INFUSION
50.0000 ug | INTRAVENOUS | Status: DC | PRN
  Filled 2016-01-21: qty 50

## 2016-01-21 MED ORDER — HYDROMORPHONE HCL 1 MG/ML IJ SOLN
1.0000 mg | Freq: Once | INTRAMUSCULAR | Status: AC
  Administered 2016-01-21: 1 mg via INTRAVENOUS
  Filled 2016-01-21: qty 1

## 2016-01-21 MED ORDER — IOHEXOL 350 MG/ML SOLN
100.0000 mL | Freq: Once | INTRAVENOUS | Status: AC | PRN
  Administered 2016-01-21: 100 mL via INTRAVENOUS

## 2016-01-21 MED ORDER — LORAZEPAM 2 MG/ML IJ SOLN
INTRAMUSCULAR | Status: AC
  Filled 2016-01-21: qty 1

## 2016-01-21 MED ORDER — FENTANYL BOLUS VIA INFUSION
25.0000 ug | INTRAVENOUS | Status: DC | PRN
  Administered 2016-01-21: 50 ug via INTRAVENOUS
  Filled 2016-01-21: qty 50

## 2016-01-21 MED ORDER — MIDAZOLAM HCL 2 MG/2ML IJ SOLN
1.0000 mg | INTRAMUSCULAR | Status: DC | PRN
  Administered 2016-01-21 (×2): 2 mg via INTRAVENOUS
  Administered 2016-01-23: 1 mg via INTRAVENOUS
  Administered 2016-01-23 – 2016-01-24 (×3): 2 mg via INTRAVENOUS
  Filled 2016-01-21 (×8): qty 2

## 2016-01-21 MED ORDER — ONDANSETRON HCL 4 MG/2ML IJ SOLN
4.0000 mg | Freq: Once | INTRAMUSCULAR | Status: AC | PRN
  Administered 2016-01-21: 4 mg via INTRAVENOUS
  Filled 2016-01-21: qty 2

## 2016-01-21 MED ORDER — ANTISEPTIC ORAL RINSE SOLUTION (CORINZ)
7.0000 mL | OROMUCOSAL | Status: DC
  Administered 2016-01-21 – 2016-01-26 (×49): 7 mL via OROMUCOSAL

## 2016-01-21 MED ORDER — FENTANYL CITRATE (PF) 100 MCG/2ML IJ SOLN
25.0000 ug | INTRAMUSCULAR | Status: DC | PRN
  Administered 2016-01-21: 50 ug via INTRAVENOUS
  Filled 2016-01-21: qty 2

## 2016-01-21 MED ORDER — SODIUM CHLORIDE 0.9 % IV SOLN
INTRAVENOUS | Status: DC
  Administered 2016-01-21: 1.8 [IU]/h via INTRAVENOUS
  Filled 2016-01-21: qty 2.5

## 2016-01-21 MED ORDER — PROPOFOL 1000 MG/100ML IV EMUL: 5.0000 ug/kg/min | INTRAVENOUS | Status: DC

## 2016-01-21 MED ORDER — SODIUM CHLORIDE 0.9% FLUSH
10.0000 mL | Freq: Two times a day (BID) | INTRAVENOUS | Status: DC
  Administered 2016-01-21 – 2016-01-24 (×4): 10 mL
  Administered 2016-01-25: 30 mL
  Administered 2016-01-25 – 2016-01-26 (×2): 10 mL

## 2016-01-21 MED FILL — Medication: Qty: 1 | Status: AC

## 2016-01-21 NOTE — ED Notes (Signed)
Alyssa Garcia- sister and POA- please contact when pt is transferred- [phone]  [phone](pt phone but sister has) Home [phone]

## 2016-01-21 NOTE — Progress Notes (Signed)
Dr Delford FieldWright aware pt temp not within goal range.

## 2016-01-21 NOTE — ED Notes (Signed)
Per CC MD and Adriana Simasook, placed ice packs to pts neck, under arms and under knees as well as starting iced saline

## 2016-01-21 NOTE — ED Notes (Signed)
Attempted to call report a second time and RN is unavailable to take report d/t death. Will call as soon as she can

## 2016-01-21 NOTE — Progress Notes (Signed)
PCCM Attending Note: Imaging results reviewed. Questionable mild focal opacity on the left which could be secondary to aspiration. No significant pleural effusion appreciated. No pulmonary embolus. No intracranial lesion with only mild paranasal sinus disease. Procalcitonin is negative which further argues for acute aspiration. Starting the patient on Zosyn empirically. Serum magnesium 1.4. ABG shows acidosis that is likely metabolic. Lactic acid pending.  1. Probable aspiration pneumonia versus pneumonitis: Starting empiric Zosyn. Check endotracheal aspirate. 2. Hypomagnesemia: Replacing with 3 g magnesium sulfate IV.  Alyssa Garcia, M.D. [facility] Pager:  [phone] After 3pm or if no response, call [phone] 11:14 AM 09-Oct-2016

## 2016-01-21 NOTE — Procedures (Signed)
Arterial Catheter Insertion Procedure Note Alyssa GowdaLeslie A Garcia [id number] 01/23/1961  Procedure: Insertion of Arterial Catheter  Indications: Blood pressure monitoring and Frequent blood sampling  Procedure Details Consent: Risks of procedure as well as the alternatives and risks of each were explained to the (patient/caregiver).  Consent for procedure obtained. Time Out: Verified patient identification, verified procedure, site/side was marked, verified correct patient position, special equipment/implants available, medications/allergies/relevent history reviewed, required imaging and test results available.  Performed  Maximum sterile technique was used including antiseptics, cap, gloves, gown, hand hygiene, mask and sheet. Skin prep: Chlorhexidine; local anesthetic administered 20 gauge catheter was inserted into left radial artery using the Seldinger technique.  Evaluation Blood flow good; BP tracing good. Complications: No apparent complications.   Alyssa PonderWalters, Alyssa Garcia 01/09/2016

## 2016-01-21 NOTE — ED Notes (Signed)
Pt was found by her RN Dorene GrebeNatalie after pushing Dilaudid to be having seizure-like activity and respiratory distress. Dr Adriana Simasook was notified and was at the bedside with other staff. Pt was bagged and 1 mg Narcan was administered @ 0732. Pt was found to have no purposeful breathing or no HR. CPR was initiated at 0734. Meds and sequence of events are as follows: 1 mg Narcan @ 0736 CPR was paused to read rhythm 0736 was course v-fib 0736 pt was shocked @ 120  0737 pt was found to be in PEA 0737 1 mg Epi and still pulseless CPR was continued  0739 150 mg Amiodorone 0740 20 mg Etomidate  0740 1 mg epi Pt was found to have pulse as well as strong femoral pulse 0743 100 mg Succ 0746 pt was intubated by Dr Adriana Simasook, 22@ lip and 20 teeth Pt was transported to Res A and placed on the vent by respiratory

## 2016-01-21 NOTE — ED Notes (Signed)
Attempted to call report to 2H. RN unable to take report and will call when she is available

## 2016-01-21 NOTE — Consult Note (Signed)
CARDIOLOGY CONSULT NOTE  Patient ID: Alyssa Garcia MRN: [id number] DOB/AGE: 56/06/1961 56 y.o.  Admit date: Aug 02, 2016 Consulting Physician: Dr Jamison NeighborNestor Primary Cardiologist: New Reason for Consultation: Cardiac arrest  HPI:  55 yo with history of ETOH abuse, ETOH pancreatitis, type II diabetes is seen after cardiac arrest. She was just discharged on 3/1 from hospitalization for ETOH pancreatitis. On Friday, per family she developed epigastric abdominal pain along with nausea and vomiting.  It felt similar to recent pancreatitis episode.  Given intractable pain and vomiting, she came to ER Saturday night.  She had more emesis in the ER. She was given Dilaudid then had seizure-like activity lasting around 1 minute. She then developed bradycardia progressing to PEA arrest.  CPR was begun.  At some point, her rhythm was noted to be vfib and she was shocked x 1.  Eventually had ROSC. She was intubated and started on hypothermia protocol.  CTA chest was done: no PE but LLL PNA was noted.  Initial troponin negative, ECG with nonspecific changes. Of note, amylase and lipase are normal this admission.   Echo is being done currently => on my review, EF appears to be about 20% with diffuse hypokinesis.  Mildly decreased RV systolic function.   Review of systems: Unable to obtain, patient intubated.   Past Medical History: 1. Depression. 2. Hyperlipidemia 3. ETOH abuse 4. ETOH pancreatitis 5. Type II diabetes  Family History  Problem Relation Age of Onset  . Rheum arthritis Mother   . COPD Sister     Social History   Social History  . Marital Status: Single    Spouse Name: N/A  . Number of Children: N/A  . Years of Education: N/A   Occupational History  . Not on file.   Social History Main Topics  . Smoking status: Never Smoker   . Smokeless tobacco: Not on file  . Alcohol Use: 0.0 oz/week    0 Standard drinks or equivalent per week  . Drug Use: No  . Sexual Activity: Not on  file   Other Topics Concern  . Not on file   Social History Narrative   Significant other is HCPOA per her report & uncle confirming at bedside.     Prescriptions prior to admission  Medication Sig Dispense Refill Last Dose  . buPROPion (WELLBUTRIN SR) 150 MG 12 hr tablet Take 150 mg by mouth 2 (two) times daily.  3 01/20/2016 at Unknown time  . FLUoxetine (PROZAC) 20 MG capsule Take 60 mg by mouth every morning.  3 01/20/2016 at Unknown time  . LANTUS SOLOSTAR 100 UNIT/ML Solostar Pen Inject 10-12 Units into the skin 2 (two) times daily. 12 units every morning and 10 every night  5 01/20/2016 at Unknown time  . lisinopril (PRINIVIL,ZESTRIL) 40 MG tablet Take 40 mg by mouth daily.  2 01/20/2016 at Unknown time  . metFORMIN (GLUCOPHAGE) 1000 MG tablet Take 1,000 mg by mouth 2 (two) times daily with a meal.   01/20/2016 at Unknown time  . metoprolol (LOPRESSOR) 100 MG tablet Take 1 tablet (100 mg total) by mouth 2 (two) times daily. 60 tablet 0 01/20/2016 at 2200  . omeprazole (PRILOSEC OTC) 20 MG tablet Take 20 mg by mouth daily.   01/20/2016 at Unknown time   Current Scheduled Meds: . sodium chloride  2,000 mL Intravenous Once  . antiseptic oral rinse  7 mL Mouth Rinse 10 times per day  . artificial tears  1 application Both Eyes  3 times per day  . [START ON 01/22/2016] aspirin  81 mg Per Tube Daily  . aspirin  300 mg Rectal NOW  . chlorhexidine gluconate  15 mL Mouth Rinse BID  . cisatracurium  0.1 mg/kg Intravenous Once  . fentaNYL (SUBLIMAZE) injection  100 mcg Intravenous Once  . heparin  5,000 Units Subcutaneous 3 times per day  . insulin aspart  0-9 Units Subcutaneous 6 times per day  . magnesium sulfate 1 - 4 g bolus IVPB  3 g Intravenous Once  . midazolam  2 mg Intravenous Once  . pantoprazole (PROTONIX) IV  40 mg Intravenous QHS  . piperacillin-tazobactam (ZOSYN)  IV  3.375 g Intravenous 3 times per day   Continuous Infusions: . cisatracurium (NIMBEX) infusion    . fentaNYL  infusion INTRAVENOUS    . midazolam (VERSED) infusion    . norepinephrine (LEVOPHED) Adult infusion    . propofol (DIPRIVAN) infusion Stopped (01/11/2016 1207)   PRN Meds:.sodium chloride, cisatracurium **AND** cisatracurium (NIMBEX) infusion **AND** cisatracurium, fentaNYL, fentaNYL, midazolam, midazolam  Physical exam Blood pressure 97/72, pulse 89, temperature 99.9 F (37.7 C), temperature source Oral, resp. rate 18, weight 187 lb 2.7 oz (84.9 kg), SpO2 100 %. General: Intubated/sedated.  Neck: JVP difficult, no thyromegaly or thyroid nodule.  Lungs: Coarse BS bilaterally CV: Nondisplaced PMI.  Heart regular S1/S2, no S3/S4, no murmur.  No peripheral edema.  Normal pedal pulses.  Abdomen: Soft, no hepatosplenomegaly, no distention.  Skin: Intact without lesions or rashes.  Neurologic: Sedated.  Extremities: No clubbing or cyanosis.  HEENT: Normal.   Labs:   Lab Results  Component Value Date   WBC 10.4 01/29/2016   HGB 15.0 01/03/2016   HCT 46.7* 01/04/2016   MCV 85.2 01/15/2016   PLT 262 01/23/2016    Recent Labs Lab 01/29/2016 0658  NA 137  K 3.8  CL 102  CO2 16*  BUN 14  CREATININE 0.85  CALCIUM 10.3  PROT 7.6  BILITOT 1.8*  ALKPHOS 207*  ALT 33  AST 39  GLUCOSE 269*   Radiology:  - CTA chest: No PE, there was a LLL infiltrate.   EKG: sinus tachy, slight anterolateral ST depression, prolonged QT interval (was on hypothermia protocol.   ASSESSMENT AND PLAN: 55 yo with history of ETOH abuse, ETOH pancreatitis, type II diabetes is seen after cardiac arrest. She was just discharged on 3/1 from hospitalization for ETOH pancreatitis. 1. Cardiac arrest: Occurred in the setting of nausea/vomiting and an apparent seizure after getting Dilaudid.  Initial rhythm was slow PEA but had vfib at one point.  CTA chest with LLL airspace disease. It is possible that the initial PEA arrest was triggered by aspiration in the setting of vomiting and seizing.  Initial troponin  negative, ECG with nonspecific changes.  Now undergoing hypothermia protocol. - At this point, would not take directly to cath lab.  Suspicion for aspiration as trigger for PEA event and do not know what the GI process is that was causing the nausea/vomiting and abdominal pain.  - Cycle troponin, initial negative.  Would expect small rise with arrest.  - Echo showed EF 20% with diffuse hypokinesis.  Uncertain etiology => could be due to heavy ETOH history or stunning from arrest.  Also could be ischemic cardiomyopathy.  She will need cardiac cath eventually to define coronaries, but think that unless there is a marked rise in troponin would not do urgently. 2. Nausea/vomiting/abdominal pain: Similar presentation to recent ETOH cardiomyopathy but amylase and  lipase not elevated.  Etiology uncertain => hemoglobin not low and transaminases not elevated. Will need further evaluation.  3. Pulmonary: LLL PNA, ?aspiration.  Covering with abx.  4. Cardiomyopathy: On my review of echo at beside, EF 20% with diffuse hypokinesis.  Etiology uncertain => ETOH cardiomyopathy versus stunning from arrest versus ischemic CMP. Cath when more stable.  Will give ASA for now.   Signed: Marca Ancona 01/27/2016 1:20 PM

## 2016-01-21 NOTE — Procedures (Signed)
Central Venous Catheter Insertion Procedure Note Alyssa Garcia [id number] 12/03/1960  Procedure: Insertion of Central Venous Catheter Indications: Assessment of intravascular volume, Drug and/or fluid administration and Frequent blood sampling  Procedure Details Consent: Risks of procedure as well as the alternatives and risks of each were explained to the (patient/caregiver).  Consent for procedure obtained. Time Out: Verified patient identification, verified procedure, site/side was marked, verified correct patient position, special equipment/implants available, medications/allergies/relevent history reviewed, required imaging and test results available.  Performed  Maximum sterile technique was used including antiseptics, cap, gloves, gown, hand hygiene, mask and sheet. Skin prep: Chlorhexidine; local anesthetic administered A antimicrobial bonded/coated triple lumen catheter was placed in the left internal jugular vein using the Seldinger technique. Ultrasound guidance used.Yes.   Catheter placed to 20 cm. Blood aspirated via all 3 ports and then flushed x 3. Line sutured x 2 and dressing applied.  Evaluation Blood flow good Complications: No apparent complications Patient did tolerate procedure well. Chest X-ray ordered to verify placement.  CXR: pending       Brett CanalesSteve Minor ACNP [facility] Pager [phone] till 3 pm If no answer page [phone] 02/02/2016, 4:56 PM  Levy Pupaobert Byrum, MD, PhD 01/24/2016, 9:38 PM  Pulmonary and Critical Care [phone] or if no answer [phone]

## 2016-01-21 NOTE — Progress Notes (Signed)
Pharmacy Antibiotic Note  Alyssa Garcia is a 55 y.o. female admitted on 01/26/2016 with probable aspiration pneumonia.  Pharmacy has been consulted for zosyn dosing.  Plan: - Zosyn 3.375g IV (30 minute infusion) x1 - Followed by Zosyn 3.375g IV q8h (4hr infusion) - Monitor C&S, CBC, renal function and clinical progression  Temp (24hrs), Avg:99.6 F (37.6 C), Min:97.9 F (36.6 C), Max:100.6 F (38.1 C)   Recent Labs Lab 01/20/2016 0658  WBC 10.4  CREATININE 0.85    CrCl cannot be calculated (Unknown ideal weight.).    No Known Allergies  Antimicrobials this admission: Zosyn 3/19>>  Microbiology results: 3/19 Sputum: pending 3/19 MRSA PCR: pending  Thank you for allowing pharmacy to be a part of this patient's care.  Alyssa Garcia, PharmD. PGY-1 Pharmacy Resident Pager: [phone] 01/18/2016 11:39 AM

## 2016-01-21 NOTE — H&P (Signed)
PULMONARY / CRITICAL CARE MEDICINE   Name: Alyssa Garcia MRN: [id number] DOB: 1961-06-09    ADMISSION DATE:  01/12/2016 CONSULTATION DATE:  01/13/2016  REFERRING MD:  [facility] EDP  CHIEF COMPLAINT:  CARDIAC ARREST  HISTORY OF PRESENT ILLNESS:   Patient was discharged on 3/1 from hospital after being treated for alcoholic pancreatitis. At presentation nurse report documented shows she began having abdominal pain on Friday evening with nausea and vomiting. The emesis had progressed. During IV push of Dilaudid patient began having seizure-like activity. Emergency department physician was contacted and convulsions lasted approximately 1 minute. This progressed to a stair and slow rhythm. Respirations became agonal and patient became pulseless. CPR was initiated. Per documentation patient initially was in coarse V. fib and shock was administered. Patient was subsequently found to be in pulseless electrical activity. Epinephrine was administered twice as well as 150 mg of amiodarone. Family at bedside reports that the patient had only been complaining of abdominal pain with nausea and vomiting. She had not reported any difficulty breathing. The only additional thing of note the patient had missed some of her blood pressure medications.  PAST MEDICAL HISTORY :  Past Medical History  Diagnosis Date  . Depression   . Hyperlipidemia   . Diabetes mellitus without complication (HCC)   . Pancreatitis     Secondary to EtOH    PAST SURGICAL HISTORY: Past Surgical History  Procedure Laterality Date  . Knee surgery      No Known Allergies  No current facility-administered medications on file prior to encounter.   Current Outpatient Prescriptions on File Prior to Encounter  Medication Sig  . buPROPion (WELLBUTRIN SR) 150 MG 12 hr tablet Take 150 mg by mouth 2 (two) times daily.  Marland Kitchen FLUoxetine (PROZAC) 20 MG capsule Take 60 mg by mouth every morning.  Marland Kitchen LANTUS SOLOSTAR 100 UNIT/ML Solostar Pen  Inject 10-12 Units into the skin 2 (two) times daily. 12 units every morning and 10 every night  . lisinopril (PRINIVIL,ZESTRIL) 40 MG tablet Take 40 mg by mouth daily.  . metFORMIN (GLUCOPHAGE) 1000 MG tablet Take 1,000 mg by mouth 2 (two) times daily with a meal.  . metoprolol (LOPRESSOR) 100 MG tablet Take 1 tablet (100 mg total) by mouth 2 (two) times daily.  Marland Kitchen omeprazole (PRILOSEC OTC) 20 MG tablet Take 20 mg by mouth daily.    FAMILY HISTORY:  Family History  Problem Relation Age of Onset  . Rheum arthritis Mother   . COPD Sister     SOCIAL HISTORY: Social History  Substance Use Topics  . Smoking status: Never Smoker   . Smokeless tobacco: None  . Alcohol Use: 0.0 oz/week    0 Standard drinks or equivalent per week    REVIEW OF SYSTEMS:  Unable to obtain given intubation & altered mental status.  SUBJECTIVE:   VITAL SIGNS: BP 120/94 mmHg  Pulse 113  Temp(Src) 100.6 F (38.1 C) (Oral)  Resp 29  SpO2 100%  HEMODYNAMICS:    VENTILATOR SETTINGS: Vent Mode:  [-] PRVC FiO2 (%):  [100 %] 100 % Set Rate:  [14 bmp] 14 bmp Vt Set:  [450 mL] 450 mL PEEP:  [5 cmH20] 5 cmH20  INTAKE / OUTPUT:    PHYSICAL EXAMINATION: General:  No acute distress. Sedated on Propofol. Family at bedside.  Integument:  Warm & dry. No rash on exposed skin.  Lymphatics:  No appreciated cervical or supraclavicular lymphadenoapthy. HEENT:  No scleral injection or icterus. Endotracheal tube in  place. PERRL. Cardiovascular:  Regular rate. No edema. No appreciable JVD.  Pulmonary:  Good aeration & clear to auscultation bilaterally. Symmetric chest wall rise on ventilator. Abdomen: Soft. Normal bowel sounds. Protuberant. Musculoskeletal:  Normal bulk. Hand grip strength 5/5 bilaterally. No joint deformity or effusion appreciated. Neurological:  Cough & gag reflexes in tact. Posturing. Upward going toes bilaterally. Symmetric pupils. Symmetric reflexes. No withdrawal to pain. Psychiatric:   Unable to assess given altered mental status.  LABS:  BMET  Recent Labs Lab 01/29/2016 0658  NA 137  K 3.8  CL 102  CO2 16*  BUN 14  CREATININE 0.85  GLUCOSE 269*    Electrolytes  Recent Labs Lab 01/26/2016 0658  CALCIUM 10.3    CBC  Recent Labs Lab 01/27/2016 0658  WBC 10.4  HGB 15.0  HCT 46.7*  PLT 262    Coag's No results for input(s): APTT, INR in the last 168 hours.  Sepsis Markers No results for input(s): LATICACIDVEN, PROCALCITON, O2SATVEN in the last 168 hours.  ABG No results for input(s): PHART, PCO2ART, PO2ART in the last 168 hours.  Liver Enzymes  Recent Labs Lab 01/23/2016 0658  AST 39  ALT 33  ALKPHOS 207*  BILITOT 1.8*  ALBUMIN 4.4    Cardiac Enzymes No results for input(s): TROPONINI, PROBNP in the last 168 hours.  Glucose No results for input(s): GLUCAP in the last 168 hours.  Imaging Dg Chest Portable 1 View  02/02/2016  CLINICAL DATA:  Hypoxia EXAM: PORTABLE CHEST 1 VIEW COMPARISON:  December 31, 2015 FINDINGS: Endotracheal tube tip is 5.2 cm above the carina. No pneumothorax. No edema or consolidation. Heart is upper normal in size with pulmonary vascularity within normal limits. No adenopathy. No bone lesions. IMPRESSION: Endotracheal tube as described without pneumothorax. No edema or consolidation. Stable cardiac silhouette. Electronically Signed   By: Bretta BangWilliam  Woodruff III M.D.   On: 01/04/2016 08:39     STUDIES:  Port CXR 3/19:  Personally reviewed by me. ETT too high. No focal opacity or effusion. EKG 3/19:  Sinus tachycardia. QTc 669ms. LAE. ST segment depression in lateral leads.  CULTURES: MRSA PCR 3/19>>>  ANTIBIOTICS: None.  SIGNIFICANT EVENTS: 3/01 - Discharge after hospitalization for alcoholic pancreatitis & hypertensive urgency  LINES/TUBES: OETT 7.0 3/19>>> OGT 3/19>>> FOLEY 3/19>>> PIV x2  ASSESSMENT / PLAN:  PULMONARY A: Acute Respiratory Failure - Post arrest.  P:   Advance ETT 3cm Full  Vent Support Stat ABG Holding on SBT until/if mental status improves CTA chest stat  CARDIOVASCULAR A:  Cardiac Arrest H/O HTN  P:  Continuing ice packs & iced saline Telemetry monitoring Vitals per unit Protocol ASA Cardiology Consult on arrival to 2H TTE pending Holding outpatient Lisinopril & Lopressor  RENAL A:   No acute issue.  P:   Monitor renal function w/ Foley Trending BUN/Creatinine & Electrolytes daily  GASTROINTESTINAL A:   H/O Pancreatitis - Secondary to EtOH. Lipase normal.  P:   Stat Amylase NPO Protonix IV daily  HEMATOLOGIC A:   No acute issues.  P:  Trend cell counts daily w/ CBC Stat INR & PTT SCDs Heparin [place] q8hr  INFECTIOUS A:   No acute issue.  P:   Monitor for signs of infection/leukocytosis  ENDOCRINE A:   H/O DM Type II - A1c  6.7 on 3/2.  P:   Accu-Checks q4hr while NPO SSI per low dose algorithm Holding outpatient Metformin  NEUROLOGIC A:   Possible Seizure - During IV Dilaudid push in  ED. Sedation on Ventilator H/O Depression  P:   RASS goal: 0 to -1 (when not on paralytics) Propofol gtt Versed IV prn Fentanyl IV prn EEG stat CT Head w/o Stat Holding outpatient Prozac & Wellbutrin  FAMILY  - Updates:  Significant other (HCPOA) and uncle updated by Dr. Jamison Neighbor at bedside 3/19  - Inter-disciplinary family meet or Palliative Care meeting due by:  3/26   TODAY'S SUMMARY:  55 year old obese female with known history of diabetes. Recent admission for alcoholic pancreatitis. Presents with nausea, vomiting, and abdominal pain. Post brief arrest with evidence of anoxic brain injury on exam at bedside. Family/significant other updated regarding her status. Checking stat EEG given questionable seizure activity. Also checking CT head without contrast and CT angiogram of the chest. I have personally spoken with cardiology to aggressive evaluation on arrival to [facility]. I have also placed an order for  transthoracic echocardiogram. Continuing cooling with ice packs for now while further evaluating and will likely need cooling moving forward.  I have spent a total of 34 minutes of critical care time today caring for the patient, discussing the plan of care with family at bedside, contacting cardiology consultant and reviewing the patient's electronic medical record.   Donna Christen Jamison Neighbor, M.D. [facility] Pager:  [phone] After 3pm or if no response, call [phone] 2016-01-22, 9:20 AM

## 2016-01-21 NOTE — Progress Notes (Signed)
Advanced ETT by 3 cm to 25 at Lip. [facility] preparing to transport Pt.

## 2016-01-21 NOTE — ED Notes (Signed)
Pt states that she was recently seen and admitted for Pancreatitis; pt states that she began having abd pain again Friday evening and vomiting; pt states that the pain and the vomiting have gotten worse; pt states that this feels the same as when she was admitted for Pancreatitis

## 2016-01-21 NOTE — ED Notes (Signed)
Gave [facility] report

## 2016-01-21 NOTE — Progress Notes (Signed)
Notified Dr Delton CoombesByrum of pat's BP being elevated in spite of titration of sedation/paralytic as well as K 2.9.

## 2016-01-21 NOTE — Progress Notes (Signed)
  Echocardiogram 2D Echocardiogram has been performed.  Arvil ChacoFoster, Harli Engelken 07-Oct-2016, 2:23 PM

## 2016-01-21 NOTE — ED Notes (Signed)
During slow IV push of Dilaudid, patient began having seizure-like activity. This RN called for MD presence and lorazepam to bedside, pending MD orders. Convulsions lasted about 1 minute and progressed to a staring state with slow rhythm. This RN called staff to bring Narcan to the bedside, pending MD orders. Respirations became agonal, pt became pulseless. CPR initiated.

## 2016-01-21 NOTE — Progress Notes (Signed)
[facility] Physician-Brief Progress Note Patient Name: [name] DOB: 08/06/1961 MRN: [id number]   Date of Service  01/05/2016  HPI/Events of Note  Hypercarbic and acidotic on abg  eICU Interventions  Adjust rate of vent upward     Intervention Category Major Interventions: Respiratory failure - evaluation and management  [name] 01/27/2016, 4:24 PM

## 2016-01-21 NOTE — Progress Notes (Signed)
[facility] Physician-Brief Progress Note Patient Name: Alyssa Garcia DOB: 09/18/1961 MRN: [id number]   Date of Service  01/04/2016  HPI/Events of Note  Persistent hyperglycemia  eICU Interventions  Start insulin drip     Intervention Category Major Interventions: Hyperglycemia - active titration of insulin therapy  Shan Levansatrick Nazim Kadlec 01/13/2016, 9:18 PM

## 2016-01-21 NOTE — ED Notes (Addendum)
CC MD, Nestor at bedside speaking with family

## 2016-01-21 NOTE — ED Provider Notes (Addendum)
CSN: [id number]     Arrival date & time 01/14/2016  [id number] History   First MD Initiated Contact with Patient 02/01/2016 0703     Chief Complaint  Patient presents with  . Abdominal Pain     (Consider location/radiation/quality/duration/timing/severity/associated sxs/prior Treatment) HPI..... Level V caveat for urgent need for intervention and acuity of illness. Patient recently admitted to the hospital for pancreatitis on February 26 and discharged on March 2.   Patient presents today with epigastric pain or 2-3 days.  Past medical history includes diabetes, hypertension, hyperlipidemia, depression, alcoholism.  Past Medical History  Diagnosis Date  . Depression   . Hyperlipidemia   . Diabetes mellitus without complication [facility](HCC)    Past Surgical History  Procedure Laterality Date  . Knee surgery     No family history on file. Social History  Substance Use Topics  . Smoking status: Never Smoker   . Smokeless tobacco: None  . Alcohol Use: No   OB History    No data available     Review of Systems  Reason unable to perform ROS:  urgent need or intervention.      Allergies  Review of patient's allergies indicates no known allergies.  Home Medications   Prior to Admission medications   Medication Sig Start Date End Date Taking? Authorizing Provider  buPROPion (WELLBUTRIN SR) 150 MG 12 hr tablet Take 150 mg by mouth 2 (two) times daily. 11/03/15  Yes Historical Provider, MD  FLUoxetine (PROZAC) 20 MG capsule Take 60 mg by mouth every morning. 11/03/15  Yes Historical Provider, MD  LANTUS SOLOSTAR 100 UNIT/ML Solostar Pen Inject 10-12 Units into the skin 2 (two) times daily. 12 units every morning and 10 every night 12/24/15  Yes Historical Provider, MD  lisinopril (PRINIVIL,ZESTRIL) 40 MG tablet Take 40 mg by mouth daily. 11/30/15  Yes Historical Provider, MD  metFORMIN (GLUCOPHAGE) 1000 MG tablet Take 1,000 mg by mouth 2 (two) times daily with a meal.   Yes Historical Provider,  MD  metoprolol (LOPRESSOR) 100 MG tablet Take 1 tablet (100 mg total) by mouth 2 (two) times daily. 01/04/16  Yes Catarina Hartshornavid Tat, MD  omeprazole (PRILOSEC OTC) 20 MG tablet Take 20 mg by mouth daily.   Yes Historical Provider, MD   BP 120/94 mmHg  Pulse 109  Temp(Src) 99.9 F (37.7 C) (Oral)  Resp 23  SpO2 100% Physical Exam  Constitutional: She is oriented to person, place, and time. She appears well-developed and well-nourished.  HENT:  Head: Normocephalic and atraumatic.  Eyes: Conjunctivae and EOM are normal. Pupils are equal, round, and reactive to light.  Neck: Normal range of motion. Neck supple.  Cardiovascular: Normal rate and regular rhythm.   Pulmonary/Chest: Effort normal and breath sounds normal.  Abdominal: Soft. Bowel sounds are normal.   Tender epigastrium  Musculoskeletal: Normal range of motion.  Neurological: She is alert and oriented to person, place, and time.  Skin: Skin is warm and dry.  Psychiatric: She has a normal mood and affect. Her behavior is normal.  Nursing note and vitals reviewed.   ED Course  .Intubation Date/Time: 01/18/2016 7:40 AM Performed by: Donnetta HutchingOOK, Rue Tinnel Authorized by: Donnetta HutchingOOK, Yuvaan Olander Comments: Patient premedicated with etomidate 20 mg and succinylcholine 100 mg. Patient was intubated with a 7.0 endotracheal tube without complications. Good CO2 exchange. Chest x-ray ordered.   (including critical care time) Labs Review Labs Reviewed  COMPREHENSIVE METABOLIC PANEL - Abnormal; Notable for the following:    CO2 16 (*)  Glucose, Bld 269 (*)    Alkaline Phosphatase 207 (*)    Total Bilirubin 1.8 (*)    Anion gap 19 (*)    All other components within normal limits  CBC - Abnormal; Notable for the following:    RBC 5.48 (*)    HCT 46.7 (*)    All other components within normal limits  D-DIMER, QUANTITATIVE (NOT AT [facility]) - Abnormal; Notable for the following:    D-Dimer, Quant 2.42 (*)    All other components within normal limits  LIPASE, BLOOD   URINALYSIS, ROUTINE W REFLEX MICROSCOPIC (NOT AT [facility])  Rosezena Sensor, ED    Imaging Review Dg Chest Portable 1 View  01/29/2016  CLINICAL DATA:  Hypoxia EXAM: PORTABLE CHEST 1 VIEW COMPARISON:  December 31, 2015 FINDINGS: Endotracheal tube tip is 5.2 cm above the carina. No pneumothorax. No edema or consolidation. Heart is upper normal in size with pulmonary vascularity within normal limits. No adenopathy. No bone lesions. IMPRESSION: Endotracheal tube as described without pneumothorax. No edema or consolidation. Stable cardiac silhouette. Electronically Signed   By: Bretta Bang III M.D.   On: 01/11/2016 08:39   I have personally reviewed and evaluated these images and lab results as part of my medical decision-making.   EKG Interpretation None     CRITICAL CARE Performed by: Donnetta Hutching Total critical care time:60 minutes Critical care time was exclusive of separately billable procedures and treating other patients. Critical care was necessary to treat or prevent imminent or life-threatening deterioration. Critical care was time spent personally by me on the following activities: development of treatment plan with patient and/or surrogate as well as nursing, discussions with consultants, evaluation of patient's response to treatment, examination of patient, obtaining history from patient or surrogate, ordering and performing treatments and interventions, ordering and review of laboratory studies, ordering and review of radiographic studies, pulse oximetry and re-evaluation of patient's condition. MDM   Final diagnoses:  Cardiac arrest (HCC)    On initial exam, patient complained of epigastric pain. She appeared to be hemodynamically stable. Soon after, I was called into the room to assess the patient's deteriorating condition. Registered nurse reported a seizure-like activity for approximately 1 minute as she  administering the Dilaudid. Patient was turning blue and was  pulseless. CPR initiated. Ambu bag applied. Narcan given. Monitor showed  pulseless electrical activity, but then transpired to ventricular fibrillation. Patient was shocked. IV epinephrine and amiodarone administered. Patient intubated with a 7.0 ET tube. See note above.  Patient was noted to have a return of her pulses after the above maneuvers. Her color improved. I discussed her care with the critical care physician on call Dr. Jamison Neighbor.  He recommended iced saline and cold packs. Patient will be transferred to cardiac intensive care at [facility].    Donnetta Hutching, MD 01/24/2016 1914  Donnetta Hutching, MD 01/22/2016 [phone]

## 2016-01-21 NOTE — Progress Notes (Signed)
Pt arrived via [facility] at 1130. Upon arrival, on 3080mcg/kg/min diprivan,75 mcg/hr Fentanyl. Pt exhibiting posturing like movements. Dr Jamison NeighborNestor notified, orders to hold sedation at this time, and that Dr Delton CoombesByrum would be in to assess neuro status and give further orders.

## 2016-01-22 ENCOUNTER — Inpatient Hospital Stay (HOSPITAL_COMMUNITY): Payer: BLUE CROSS/BLUE SHIELD

## 2016-01-22 DIAGNOSIS — R4182 Altered mental status, unspecified: Secondary | ICD-10-CM

## 2016-01-22 DIAGNOSIS — R778 Other specified abnormalities of plasma proteins: Secondary | ICD-10-CM

## 2016-01-22 DIAGNOSIS — I420 Dilated cardiomyopathy: Secondary | ICD-10-CM

## 2016-01-22 DIAGNOSIS — R7989 Other specified abnormal findings of blood chemistry: Secondary | ICD-10-CM

## 2016-01-22 DIAGNOSIS — J9690 Respiratory failure, unspecified, unspecified whether with hypoxia or hypercapnia: Secondary | ICD-10-CM | POA: Insufficient documentation

## 2016-01-22 LAB — BASIC METABOLIC PANEL
ANION GAP: 11 (ref 5–15)
Anion gap: 14 (ref 5–15)
Anion gap: 12 (ref 5–15)
Anion gap: 9 (ref 5–15)
Anion gap: 12 (ref 5–15)
BUN: 9 mg/dL (ref 6–20)
BUN: 9 mg/dL (ref 6–20)
BUN: 8 mg/dL (ref 6–20)
BUN: 8 mg/dL (ref 6–20)
BUN: 10 mg/dL (ref 6–20)
CALCIUM: 8.7 mg/dL — AB (ref 8.9–10.3)
CALCIUM: 8.9 mg/dL (ref 8.9–10.3)
CHLORIDE: 112 mmol/L — AB (ref 101–111)
CHLORIDE: 113 mmol/L — AB (ref 101–111)
CO2: 15 mmol/L — ABNORMAL LOW (ref 22–32)
CO2: 15 mmol/L — ABNORMAL LOW (ref 22–32)
CO2: 16 mmol/L — ABNORMAL LOW (ref 22–32)
CO2: 17 mmol/L — ABNORMAL LOW (ref 22–32)
CO2: 15 mmol/L — AB (ref 22–32)
CREATININE: 0.64 mg/dL (ref 0.44–1.00)
CREATININE: 0.54 mg/dL (ref 0.44–1.00)
CREATININE: 0.65 mg/dL (ref 0.44–1.00)
CREATININE: 0.57 mg/dL (ref 0.44–1.00)
Calcium: 8.6 mg/dL — ABNORMAL LOW (ref 8.9–10.3)
Calcium: 8.7 mg/dL — ABNORMAL LOW (ref 8.9–10.3)
Calcium: 8.8 mg/dL — ABNORMAL LOW (ref 8.9–10.3)
Chloride: 111 mmol/L (ref 101–111)
Chloride: 112 mmol/L — ABNORMAL HIGH (ref 101–111)
Chloride: 114 mmol/L — ABNORMAL HIGH (ref 101–111)
Creatinine, Ser: 0.51 mg/dL (ref 0.44–1.00)
GFR calc Af Amer: >60 mL/min (ref >60)
GFR calc Af Amer: >60 mL/min (ref >60)
GFR calc non Af Amer: >60 mL/min (ref >60)
GFR calc non Af Amer: >60 mL/min (ref >60)
GLUCOSE: 266 mg/dL — AB (ref 65–99)
GLUCOSE: 190 mg/dL — AB (ref 65–99)
GLUCOSE: 118 mg/dL — AB (ref 65–99)
Glucose, Bld: 160 mg/dL — ABNORMAL HIGH (ref 65–99)
Glucose, Bld: 152 mg/dL — ABNORMAL HIGH (ref 65–99)
POTASSIUM: 4.2 mmol/L (ref 3.5–5.1)
POTASSIUM: 3.1 mmol/L — AB (ref 3.5–5.1)
Potassium: 3 mmol/L — ABNORMAL LOW (ref 3.5–5.1)
Potassium: 2.9 mmol/L — ABNORMAL LOW (ref 3.5–5.1)
Potassium: 3.1 mmol/L — ABNORMAL LOW (ref 3.5–5.1)
SODIUM: 140 mmol/L (ref 135–145)
SODIUM: 139 mmol/L (ref 135–145)
SODIUM: 139 mmol/L (ref 135–145)
SODIUM: 140 mmol/L (ref 135–145)
Sodium: 140 mmol/L (ref 135–145)

## 2016-01-22 LAB — BLOOD GAS, ARTERIAL
ACID-BASE DEFICIT: 8.8 mmol/L — AB (ref 0.0–2.0)
Bicarbonate: 16.7 mEq/L — ABNORMAL LOW (ref 20.0–24.0)
DRAWN BY: 276051
FIO2: 1
MECHVT: 450 mL
O2 SAT: 99.8 %
PEEP/CPAP: 5 cmH2O
PH ART: 7.272 — AB (ref 7.350–7.450)
Patient temperature: 38
RATE: 14 resp/min
TCO2: 15.3 mmol/L (ref 0–100)
pCO2 arterial: 38.1 mmHg (ref 35.0–45.0)

## 2016-01-22 LAB — GLUCOSE, CAPILLARY
GLUCOSE-CAPILLARY: 128 mg/dL — AB (ref 65–99)
GLUCOSE-CAPILLARY: 138 mg/dL — AB (ref 65–99)
GLUCOSE-CAPILLARY: 149 mg/dL — AB (ref 65–99)
GLUCOSE-CAPILLARY: 166 mg/dL — AB (ref 65–99)
GLUCOSE-CAPILLARY: 216 mg/dL — AB (ref 65–99)
Glucose-Capillary: 195 mg/dL — ABNORMAL HIGH (ref 65–99)
Glucose-Capillary: 154 mg/dL — ABNORMAL HIGH (ref 65–99)
Glucose-Capillary: 144 mg/dL — ABNORMAL HIGH (ref 65–99)
Glucose-Capillary: 170 mg/dL — ABNORMAL HIGH (ref 65–99)
Glucose-Capillary: 111 mg/dL — ABNORMAL HIGH (ref 65–99)

## 2016-01-22 LAB — MAGNESIUM
MAGNESIUM: 1.7 mg/dL (ref 1.7–2.4)
Magnesium: 2.2 mg/dL (ref 1.7–2.4)

## 2016-01-22 LAB — POCT I-STAT, CHEM 8
BUN: 8 mg/dL (ref 6–20)
Calcium, Ion: 1.22 mmol/L (ref 1.12–1.23)
Chloride: 107 mmol/L (ref 101–111)
Creatinine, Ser: 0.4 mg/dL — ABNORMAL LOW (ref 0.44–1.00)
Glucose, Bld: 182 mg/dL — ABNORMAL HIGH (ref 65–99)
HEMATOCRIT: 43 % (ref 36.0–46.0)
HEMOGLOBIN: 14.6 g/dL (ref 12.0–15.0)
POTASSIUM: 3.6 mmol/L (ref 3.5–5.1)
SODIUM: 141 mmol/L (ref 135–145)
TCO2: 16 mmol/L (ref 0–100)

## 2016-01-22 LAB — POCT I-STAT 3, ART BLOOD GAS (G3+)
ACID-BASE DEFICIT: 6 mmol/L — AB (ref 0.0–2.0)
BICARBONATE: 16.6 meq/L — AB (ref 20.0–24.0)
O2 SAT: 100 %
TCO2: 17 mmol/L (ref 0–100)
pCO2 arterial: 21.9 mmHg — ABNORMAL LOW (ref 35.0–45.0)
pH, Arterial: 7.471 — ABNORMAL HIGH (ref 7.350–7.450)
pO2, Arterial: 173 mmHg — ABNORMAL HIGH (ref 80.0–100.0)

## 2016-01-22 LAB — LACTIC ACID, PLASMA
LACTIC ACID, VENOUS: 1.5 mmol/L (ref 0.5–2.0)
LACTIC ACID, VENOUS: 0.9 mmol/L (ref 0.5–2.0)

## 2016-01-22 LAB — CBC
HCT: 41.7 % (ref 36.0–46.0)
Hemoglobin: 13.6 g/dL (ref 12.0–15.0)
MCH: 27.4 pg (ref 26.0–34.0)
MCHC: 32.6 g/dL (ref 30.0–36.0)
MCV: 83.9 fL (ref 78.0–100.0)
PLATELETS: 113 10*3/uL — AB (ref 150–400)
RBC: 4.97 MIL/uL (ref 3.87–5.11)
RDW: 14.6 % (ref 11.5–15.5)
WBC: 11.2 10*3/uL — AB (ref 4.0–10.5)

## 2016-01-22 LAB — TRIGLYCERIDES: Triglycerides: 135 mg/dL (ref <150)

## 2016-01-22 LAB — TROPONIN I: TROPONIN I: 0.1 ng/mL — AB (ref <0.031)

## 2016-01-22 LAB — PHOSPHORUS
PHOSPHORUS: 1.7 mg/dL — AB (ref 2.5–4.6)
Phosphorus: 4 mg/dL (ref 2.5–4.6)

## 2016-01-22 LAB — PROCALCITONIN: PROCALCITONIN: 0.57 ng/mL

## 2016-01-22 MED ORDER — MAGNESIUM SULFATE 2 GM/50ML IV SOLN
2.0000 g | Freq: Once | INTRAVENOUS | Status: AC
  Administered 2016-01-22: 2 g via INTRAVENOUS
  Filled 2016-01-22: qty 50

## 2016-01-22 MED ORDER — DEXTROSE 10 % IV SOLN: INTRAVENOUS | Status: DC | PRN

## 2016-01-22 MED ORDER — INSULIN ASPART 100 UNIT/ML ~~LOC~~ SOLN: 2.0000 [IU] | SUBCUTANEOUS | Status: DC

## 2016-01-22 MED ORDER — INSULIN GLARGINE 100 UNIT/ML ~~LOC~~ SOLN
20.0000 [IU] | SUBCUTANEOUS | Status: DC
  Administered 2016-01-22: 20 [IU] via SUBCUTANEOUS
  Filled 2016-01-22: qty 0.2

## 2016-01-22 MED ORDER — INSULIN GLARGINE 100 UNIT/ML ~~LOC~~ SOLN
20.0000 [IU] | SUBCUTANEOUS | Status: DC
  Filled 2016-01-22: qty 0.2

## 2016-01-22 MED ORDER — INSULIN ASPART 100 UNIT/ML ~~LOC~~ SOLN
2.0000 [IU] | SUBCUTANEOUS | Status: DC
  Administered 2016-01-22: 2 [IU] via SUBCUTANEOUS
  Administered 2016-01-22: 4 [IU] via SUBCUTANEOUS

## 2016-01-22 MED ORDER — POTASSIUM CHLORIDE 10 MEQ/50ML IV SOLN
10.0000 meq | INTRAVENOUS | Status: AC
  Administered 2016-01-22 (×3): 10 meq via INTRAVENOUS
  Filled 2016-01-22 (×3): qty 50

## 2016-01-22 MED ORDER — SODIUM CHLORIDE 0.9 % IV SOLN
1000.0000 mg | Freq: Two times a day (BID) | INTRAVENOUS | Status: DC
  Administered 2016-01-22 – 2016-01-25 (×9): 1000 mg via INTRAVENOUS
  Filled 2016-01-22 (×11): qty 10

## 2016-01-22 MED ORDER — PNEUMOCOCCAL VAC POLYVALENT 25 MCG/0.5ML IJ INJ: 0.5000 mL | INJECTION | INTRAMUSCULAR | Status: DC

## 2016-01-22 MED ORDER — POTASSIUM CHLORIDE 10 MEQ/50ML IV SOLN
10.0000 meq | INTRAVENOUS | Status: AC
  Administered 2016-01-22 (×4): 10 meq via INTRAVENOUS
  Filled 2016-01-22 (×4): qty 50

## 2016-01-22 MED ORDER — DEXTROSE 5 % IV SOLN
30.0000 mmol | Freq: Once | INTRAVENOUS | Status: AC
  Administered 2016-01-22: 30 mmol via INTRAVENOUS
  Filled 2016-01-22: qty 10

## 2016-01-22 MED ORDER — INSULIN GLARGINE 100 UNIT/ML ~~LOC~~ SOLN
20.0000 [IU] | SUBCUTANEOUS | Status: AC
  Filled 2016-01-22: qty 0.2

## 2016-01-22 MED ORDER — INSULIN ASPART 100 UNIT/ML ~~LOC~~ SOLN
0.0000 [IU] | SUBCUTANEOUS | Status: DC
  Administered 2016-01-23 (×4): 1 [IU] via SUBCUTANEOUS
  Administered 2016-01-24 (×3): 2 [IU] via SUBCUTANEOUS
  Administered 2016-01-24: 1 [IU] via SUBCUTANEOUS
  Administered 2016-01-24 – 2016-01-25 (×3): 2 [IU] via SUBCUTANEOUS
  Administered 2016-01-25: 3 [IU] via SUBCUTANEOUS
  Administered 2016-01-25: 2 [IU] via SUBCUTANEOUS
  Administered 2016-01-25: 3 [IU] via SUBCUTANEOUS
  Administered 2016-01-26 (×3): 5 [IU] via SUBCUTANEOUS

## 2016-01-22 NOTE — Progress Notes (Signed)
eLink Physician-Brief Progress Note Patient Name: Alyssa Garcia DOB: 09/12/1961 MRN: [id number]   Date of Service  01/22/2016  HPI/Events of Note  Patient is now off of an insulin IV infusion and on Lantus alone.   eICU Interventions  Will add Q 4 hour sensitive Novolog SSI coverage.     Intervention Category Intermediate Interventions: Hyperglycemia - evaluation and treatment  Sommer,Steven Eugene 01/22/2016, 8:16 PM

## 2016-01-22 NOTE — Progress Notes (Signed)
[facility] Physician-Brief Progress Note Patient Name: Alyssa GowdaLeslie A Werntz DOB: 01/02/1961 MRN: [id number]   Date of Service  01/22/2016  HPI/Events of Note  Case discussed briefly with on-call neurologist. Patient did not undergo stat EEG ordered at 9 AM this morning. Currently on Versed infusion and paralytic for cooling protocol.No known history of seizures.  CT of head Without contrast shows no focal abnormality or hemorrhage.  eICU Interventions  1. Neurology planning for EEG first thing in the morning 2. Continuing Versed infusion 3. 1 g IV every 12 hours first dose now     Intervention Category Major Interventions: Seizures - evaluation and management  Lawanda CousinsJennings Rebeckah Masih 01/22/2016, 12:09 AM

## 2016-01-22 NOTE — Progress Notes (Addendum)
[facility]Link Physician-Brief Progress Note Patient Name: Alyssa GowdaLeslie A Garcia DOB: 08/23/1961 MRN: [id number]   Date of Service  01/22/2016  HPI/Events of Note  Nurse notified of hypokalemia 3.0  eICU Interventions  Giving KCll 10 mg IV 3 runs. Repeat labs per protocol.     Intervention Category Intermediate Interventions: Electrolyte abnormality - evaluation and management  Lawanda CousinsJennings Yoshito Gaza 01/22/2016, 12:23 AM

## 2016-01-22 NOTE — Progress Notes (Signed)
Bedside EEG completed, results pending. 

## 2016-01-22 NOTE — Progress Notes (Signed)
ABG results called to elink Marilyn,RN.Tammy SoursAngela Steffanie Mingle

## 2016-01-22 NOTE — Progress Notes (Addendum)
Patient Name:  Alyssa Garcia, DOB: 1961/03/06, MRN: [id number] Primary Doctor: Pcp Not In System Primary Cardiologist:   Date: 01/22/2016   SUBJECTIVE:   Past Medical History  Diagnosis Date  . Depression   . Hyperlipidemia   . Diabetes mellitus without complication (HCC)   . Pancreatitis     Secondary to EtOH   Filed Vitals:   01/22/16 0600 01/22/16 0700 01/22/16 0744 01/22/16 0800  BP:   111/62   Pulse: 57 57 60   Temp: 91.9 F (33.3 C) 92.1 F (33.4 C)  92.1 F (33.4 C)  TempSrc: Core (Comment)   Core (Comment)  Resp: 28 28 28    Height:      Weight:      SpO2: 100% 100% 100%     Intake/Output Summary (Last 24 hours) at 01/22/16 0813 Last data filed at 01/22/16 0800  Gross per 24 hour  Intake 3531.1 ml  Output   3575 ml  Net  -43.9 ml   Filed Weights   23-Jan-2016 1215 01/22/16 0500  Weight: 191 lb 2.2 oz (86.7 kg) 195 lb 11.6 oz (88.78 kg)     LABS: Basic Metabolic Panel:  Recent Labs  86/57/84 0911  01-23-16 2320 01/22/16 0305  NA  --   < > 140 139  K  --   < > 3.0* 2.9*  CL  --   < > 111 112*  CO2  --   < > 15* 15*  GLUCOSE  --   < > 266* 160*  BUN  --   < > 9 9  CREATININE  --   < > 0.64 0.54  CALCIUM  --   < > 8.6* 8.7*  MG 1.4*  --   --  1.7  PHOS 3.7  --   --  1.7*  < > = values in this interval not displayed. Liver Function Tests:  Recent Labs  01-23-2016 0658  AST 39  ALT 33  ALKPHOS 207*  BILITOT 1.8*  PROT 7.6  ALBUMIN 4.4    Recent Labs  Jan 23, 2016 0658 January 23, 2016 0911  LIPASE 16  --   AMYLASE  --  61   CBC:  Recent Labs  Jan 23, 2016 1317 01/22/16 0305  WBC 12.3* 11.2*  NEUTROABS 10.0*  --   HGB 12.8 13.6  HCT 41.1 41.7  MCV 85.6 83.9  PLT 195 113*   Cardiac Enzymes:  Recent Labs  01-23-2016 1746 2016-01-23 2035 Jan 23, 2016 2320  TROPONINI 0.23* 0.13* 0.10*   BNP: Invalid input(s): POCBNP D-Dimer:  Recent Labs  2016/01/23 0807  DDIMER 2.42*   Thyroid Function Tests: No results for input(s): TSH,  T4TOTAL, T3FREE, THYROIDAB in the last 72 hours.  Invalid input(s): FREET3  RADIOLOGY: Ct Head Wo Contrast  January 23, 2016  CLINICAL DATA:  Altered mental status with seizure. Recent cardiopulmonary arrest EXAM: CT HEAD WITHOUT CONTRAST TECHNIQUE: Contiguous axial images were obtained from the base of the skull through the vertex without intravenous contrast. COMPARISON:  None. FINDINGS: The ventricles are normal in size and configuration. There is no intracranial mass hemorrhage, extra-axial fluid collection, or midline shift. The gray-white compartments are normal. There is no acute infarct evident. Gray-white differentiation appears normal. Bony calvarium appears intact. The mastoid air cells are clear. No intraorbital lesions are identified. There is mucosal thickening in the maxillary antra bilaterally as well as in several ethmoid air cells bilaterally. IMPRESSION: Mild paranasal sinus disease. No intracranial mass, hemorrhage, or edema. No acute infarct evident.  Electronically Signed   By: Bretta Bang III M.D.   On: 01/23/2016 10:15   Ct Angio Chest Pe W/cm &/or Wo Cm  02/01/2016  CLINICAL DATA:  Shortness of breath/respiratory failure EXAM: CT ANGIOGRAPHY CHEST WITH CONTRAST TECHNIQUE: Multidetector CT imaging of the chest was performed using the standard protocol during bolus administration of intravenous contrast. Multiplanar CT image reconstructions and MIPs were obtained to evaluate the vascular anatomy. CONTRAST:  OMNIPAQUE IOHEXOL 350 MG/ML SOLN COMPARISON:  Chest radiograph January 21, 2016 FINDINGS: Mediastinum/Lymph Nodes: There is no demonstrable pulmonary embolus. There is no thoracic aortic aneurysm or dissection. The visualized great vessels appear unremarkable. There are foci of coronary artery calcification. The pericardium is not thickened. Visualized thyroid appears normal. There is no appreciable thoracic adenopathy. Lungs/Pleura: There is patchy atelectasis in both lower  lobes with lesser atelectasis noted throughout the lungs bilaterally. There is focal airspace consolidation in superior and medial segments of the left lower lobe concerning for pneumonia in these areas. Endotracheal tube tip is above the carina. No pneumothorax. Upper abdomen: Nasogastric tube extends into the stomach and loops back on itself with the tip in the distal esophagus. Visualized upper abdominal structures otherwise appear unremarkable. Musculoskeletal: There are foci of degenerative change in the thoracic spine. There are no blastic or lytic bone lesions. Review of the MIP images confirms the above findings. IMPRESSION: No demonstrable pulmonary embolus. Focal consolidation concerning for pneumonia in portions of the superior and medial segments of the left lower lobe. There is also bibasilar atelectasis. Nasogastric tube extends into the stomach where it loops back upon itself with the tip in the distal esophagus. No demonstrable adenopathy. These results will be called to the ordering clinician or representative by the Radiologist Assistant, and communication documented in the PACS or zVision Dashboard. Electronically Signed   By: Bretta Bang III M.D.   On: 01/24/2016 10:29   Ct Abdomen Pelvis W Contrast  12/31/2015  CLINICAL DATA:  Mid abdominal pain with nausea vomiting and diarrhea. Elevated serum lipase level EXAM: CT ABDOMEN AND PELVIS WITH CONTRAST TECHNIQUE: Multidetector CT imaging of the abdomen and pelvis was performed using the standard protocol following bolus administration of intravenous contrast. CONTRAST:  OMNIPAQUE IOHEXOL 300 MG/ML SOLN, 25mL OMNIPAQUE IOHEXOL 300 MG/ML SOLN COMPARISON:  12/31/2015 ; report dated 02/06/2000 FINDINGS: Lower chest:  Mild cardiomegaly. Hepatobiliary: Mild diffuse hepatic steatosis. Pancreas: Diffuse peripancreatic stranding compatible with acute pancreatitis. No findings of necrosis, abscess, or pseudocyst. Spleen: Unremarkable  Adrenals/Urinary Tract: Unremarkable Stomach/Bowel: Unremarkable Vascular/Lymphatic: Upper peripancreatic lymph node 1.5 cm in short axis on image 22 series 2. Portacaval node 1.3 cm in short axis, image 24 series 2. Porta hepatis node 1.3 cm in short axis, image 27 series 2. These lymph nodes are likely reactive. Reproductive: Unremarkable Other: Small amount of perihepatic and perisplenic ascites. Trace fluid in the paracolic gutters. Musculoskeletal: Lower lumbar spondylosis and degenerative disc disease. A transitional lumbosacral vertebra is assumed to represent the S1 level. Careful correlation with this numbering strategy prior to any procedural intervention would be recommended. There is potentially prominent central narrowing of the thecal sac at the L4-5 level especially eccentric to the left due to disc bulge and disc protrusion along with at least mild left foraminal stenosis at this level. There is also intervertebral spurring at the L5-S1 level. Infraumbilical hernia contains adipose tissue. IMPRESSION: 1. Acute pancreatitis. No pancreatic abscess, pseudocyst, or necrosis. Peripancreatic stranding observed with a small amount of perihepatic and perisplenic ascites. Surrounding  porta hepatis reactive lymph nodes. 2. Lumbar spondylosis and degenerative disc disease potentially with considerable impingement at the L4-5 level (please note that S1 is transitional). 3. Infraumbilical hernia contains adipose tissue. 4. Mild cardiomegaly. 5. Mild diffuse hepatic steatosis. Electronically Signed   By: Gaylyn Rong M.D.   On: 12/31/2015 17:04   Dg Chest Port 1 View  15-Feb-2016  CLINICAL DATA:  Central line placement and shortness of breath EXAM: PORTABLE CHEST 1 VIEW COMPARISON:  02/15/2016 FINDINGS: Endotracheal tube tip is 2.7 cm above the carina. There is a left internal jugular central line with tip over the superior vena cava. Mild cardiac enlargement persists. There is now bilateral perihilar and  lower lobe opacity. Possible small left pleural effusion. IMPRESSION: Bilateral perihilar infiltrates concerning for pulmonary edema. Bilateral pneumonia not excluded. Lines and tubes as described above. Electronically Signed   By: Esperanza Heir M.D.   On: Feb 15, 2016 18:11   Dg Chest Portable 1 View  02-15-2016  CLINICAL DATA:  Hypoxia EXAM: PORTABLE CHEST 1 VIEW COMPARISON:  December 31, 2015 FINDINGS: Endotracheal tube tip is 5.2 cm above the carina. No pneumothorax. No edema or consolidation. Heart is upper normal in size with pulmonary vascularity within normal limits. No adenopathy. No bone lesions. IMPRESSION: Endotracheal tube as described without pneumothorax. No edema or consolidation. Stable cardiac silhouette. Electronically Signed   By: Bretta Bang III M.D.   On: 2016/02/15 08:39   Dg Abd Acute W/chest  12/31/2015  CLINICAL DATA:  Patient with diffuse abdominal pain, nausea and vomiting. EXAM: DG ABDOMEN ACUTE W/ 1V CHEST COMPARISON:  None. FINDINGS: Normal cardiac and mediastinal contours. No consolidative pulmonary opacities. No pleural effusion or pneumothorax. Gas is demonstrated within nondilated loops of large and small bowel in a nonobstructed pattern. No free intraperitoneal air. Unremarkable osseous skeleton. IMPRESSION: No acute cardiopulmonary process. Nonobstructed bowel gas pattern. Electronically Signed   By: Annia Belt M.D.   On: 12/31/2015 12:05    PHYSICAL EXAM  patient is intubated, sedated, cooled. Lungs reveal scattered rhonchi. Cardiac exam reveals S1 and S2. There is no peripheral edema.   TELEMETRY: Telemetry on January 22, 2016, reveals sinus bradycardia while the patient is cooled.    ASSESSMENT AND PLAN:  55 yo with history of ETOH abuse, ETOH pancreatitis, type II diabetes is seen after cardiac arrest. She was just discharged on 3/1 from hospitalization for ETOH pancreatitis.  1. Cardiac arrest: Occurred in the setting of nausea/vomiting and an  apparent seizure after getting Dilaudid. Initial rhythm was slow PEA but had vfib at one point. CTA chest with LLL airspace disease. It is possible that the initial PEA arrest was triggered by aspiration in the setting of vomiting and seizing. Undergoing hypothermia protocol. Slight troponin elevation trending down. Patient was not taken to the cath lab acutely.  Suspicion for aspiration as trigger for PEA event and do not know what the GI process is that was causing the nausea/vomiting and abdominal pain.  Cardiac stable as of January 22, 2016 - Echo showed EF 20% with diffuse hypokinesis. Uncertain etiology => could be due to heavy ETOH history or stunning from arrest. Also could be ischemic cardiomyopathy. She will need cardiac cath eventually to define coronaries. The plan will be to do this if and when she stabilizes and mental status normalizes.  2. Nausea/vomiting/abdominal pain:   3. Pulmonary: LLL PNA, ?aspiration. Covering with abx.   4. Cardiomyopathy:, EF 20% with diffuse hypokinesis, per bedside reviewed by Dr. Shirlee Latch. Etiology uncertain =>  ETOH cardiomyopathy versus stunning from arrest versus ischemic CMP. Cath when more stable.  ASA for now.    Active Problems:   Cardiac arrest [facility](HCC)   Congestive dilated cardiomyopathy (HCC)   Willa RoughJeffrey Hartlyn Reigel 01/22/2016 8:13 AM

## 2016-01-22 NOTE — Progress Notes (Signed)
CRITICAL VALUE ALERT  Critical value received:  PCO 19  Date of notification:  01/22/2016   Time of notification:  1028  Critical value read back:Yes.    Nurse who received alert:  Tammy SoursAngela Yoshiaki Kreuser  MD notified (1st page):  respirtatory therapy, Brandi  Time of first page:  1029  MD notified (2nd page):  Time of second page:  Responding MD:  Markus DaftBrandi,Respiratory  Time MD responded: 1029

## 2016-01-22 NOTE — Progress Notes (Signed)
Initial Nutrition Assessment  DOCUMENTATION CODES:   Obesity unspecified  INTERVENTION:   Once pt is rewarmed, recommend:  Initiate Vital High Protein @ 20 ml/hr via OGT and increase by 10 ml every 4 hours to goal rate of 55 ml/hr.   Tube feeding regimen provides 1320 kcal (100% of needs), 116 grams of protein, and 1104 ml of H2O.   NUTRITION DIAGNOSIS:   Inadequate oral intake related to inability to eat as evidenced by NPO status.  GOAL:   Provide needs based on ASPEN/SCCM guidelines  MONITOR:   Vent status, Labs, Weight trends, Skin, I & O's  REASON FOR ASSESSMENT:   Ventilator, Malnutrition Screening Tool    ASSESSMENT:   Patient was discharged on 3/1 from hospital after being treated for alcoholic pancreatitis. At presentation nurse report documented shows she began having abdominal pain on Friday evening with nausea and vomiting. The emesis had progressed. During IV push of Dilaudid patient began having seizure-like activity. Emergency department physician was contacted and convulsions lasted approximately 1 minute  Patient is currently intubated on ventilator support. OGT in place.  MV: 12.3 L/min Temp (24hrs), Avg:93 F (33.9 C), Min:89.4 F (31.9 C), Max:100.9 F (38.3 C)  Propofol: n/a  No family at bedside to obtain hx.   Pt was receiving EEG at time of visit. Unable to complete Nutrition-Focused physical exam at this time. This RD did not observe signs of depletion on orbital, temple, clavicle, or upper arm areas.   Case discussed with RN. Pt is currently on hypothermia protocol; no plans to start feeding today. RN reports that pt will start re-warming around 1715.   Labs reviewed: CBGS: 170.   Diet Order:  Diet NPO time specified  Skin:  Reviewed, no issues  Last BM:  PTA  Height:   Ht Readings from Last 1 Encounters:  Sep 28, 2016 5\' 4"  (1.626 m)    Weight:   Wt Readings from Last 1 Encounters:  01/22/16 195 lb 11.6 oz (88.78 kg)     Ideal Body Weight:  54.5 kg  BMI:  Body mass index is 33.58 kg/(m^2).  Estimated Nutritional Needs:   Kcal:  [id number]  Protein:  >109 grams  Fluid:  >1.2 L  EDUCATION NEEDS:   No education needs identified at this time  Amybeth Sieg A. Mayford KnifeWilliams, RD, LDN, CDE Pager: [phone] After hours Pager: [phone]

## 2016-01-22 NOTE — Procedures (Signed)
HPI:  55 y/o with seizure like activity followed by cardiac arrest, now sedated on ventilator with cooling protocol  TECHNICAL SUMMARY:  A multichannel referential and bipolar montage EEG using the standard international 10-20 system was performed on the patient described as sedated, on the ventilator with cooling protocol.  There is no occipital dominant rhythm.  There is a low voltage fairly flat background rhythm, consistent with the patient's history of being sedated.  There is no significant beta activity.  ACTIVATION:  Stepwise photic stimulation and hyperventilation are not performed  EPILEPTIFORM ACTIVITY:  There were no spikes, sharp waves or paroxysmal activity.  SLEEP:  None noted, sedated EEG  CARDIAC:  The EKG lead revealed a regular rhythm.   IMPRESSION:  This EEG demonstrated no focal, hemispheric, or lateralizing features.  There was no epileptiform activity recorded.  The EEG was fairly flat, with no occipital dominant rhythm, consistent with a sedated EEG.  Repeat EEG when the patient is off of sedation, or sedation is decreased, may be of value.  Correlate clinically.

## 2016-01-22 NOTE — Progress Notes (Signed)
PULMONARY / CRITICAL CARE MEDICINE   Name: Alyssa GowdaLeslie A Garcia MRN: [id number] DOB: 08/23/1961    ADMISSION DATE:  01/19/2016 CONSULTATION DATE:  01/09/2016  REFERRING MD:  [facility]  CHIEF COMPLAINT:  CARDIAC ARREST  HISTORY OF PRESENT ILLNESS:   Patient was discharged on 3/1 from hospital after being treated for alcoholic pancreatitis. At presentation nurse report documented shows she began having abdominal pain on Friday evening with nausea and vomiting. The emesis had progressed. During IV push of Dilaudid patient began having seizure-like activity. Emergency department physician was contacted and convulsions lasted approximately 1 minute. This progressed to a stair and slow rhythm. Respirations became agonal and patient became pulseless. CPR was initiated. Per documentation patient initially was in coarse V. fib and shock was administered. Patient was subsequently found to be in pulseless electrical activity. Epinephrine was administered twice as well as 150 mg of amiodarone. Family at bedside reports that the patient had only been complaining of abdominal pain with nausea and vomiting. She had not reported any difficulty breathing. The only additional thing of note the patient had missed some of her blood pressure medications.  SUBJECTIVE: No events overnight.  Start rewarm at 5:15 PM today.  VITAL SIGNS: BP 111/62 mmHg  Pulse 54  Temp(Src) 91.6 F (33.1 C) (Core (Comment))  Resp 28  Ht 5\' 4"  (1.626 m)  Wt 88.78 kg (195 lb 11.6 oz)  BMI 33.58 kg/m2  SpO2 100%  HEMODYNAMICS: CVP:  [1 mmHg-4 mmHg] 3 mmHg  VENTILATOR SETTINGS: Vent Mode:  [-] PRVC FiO2 (%):  [40 %-100 %] 40 % Set Rate:  [14 bmp-28 bmp] 28 bmp Vt Set:  [450 mL] 450 mL PEEP:  [5 cmH20] 5 cmH20 Plateau Pressure:  [11 cmH20-20 cmH20] 18 cmH20  INTAKE / OUTPUT: I/O last 3 completed shifts: In: 3444.7 [I.V.:1518.7; Other:250; IV Piggyback:1676] Out: 3525 [Urine:3325; Emesis/NG output:200]  PHYSICAL  EXAMINATION: General:  No acute distress. Sedated and paralyzed on versed/fentany/cis. Integument:  Warm & dry. No rash on exposed skin.  Lymphatics:  No appreciated cervical or supraclavicular lymphadenoapthy. HEENT:  No scleral injection or icterus. Endotracheal tube in place. PERRL. Cardiovascular:  Regular rate. No edema. No appreciable JVD.  Pulmonary:  Good aeration & clear to auscultation bilaterally. Symmetric chest wall rise on ventilator. Abdomen: Soft. Normal bowel sounds. Protuberant. Musculoskeletal:  Normal bulk. Hand grip strength 5/5 bilaterally. No joint deformity or effusion appreciated. Neurological:  Cough & gag reflexes in tact. Posturing. Upward going toes bilaterally. Symmetric pupils. Symmetric reflexes. No withdrawal to pain. Psychiatric:  Unable to assess given altered mental status.  LABS:  BMET  Recent Labs Lab 01/18/2016 2000 01/16/2016 2320 01/22/16 0305 01/22/16 0747  NA 139 140 139 141  K 3.3* 3.0* 2.9* 3.6  CL 108 111 112* 107  CO2 14* 15* 15*  --   BUN 11 9 9 8   CREATININE 0.76 0.64 0.54 0.40*  GLUCOSE 293* 266* 160* 182*   Electrolytes  Recent Labs Lab 01/14/2016 0911  01/26/2016 2000 01/27/2016 2320 01/22/16 0305  CALCIUM  --   < > 8.6* 8.6* 8.7*  MG 1.4*  --   --   --  1.7  PHOS 3.7  --   --   --  1.7*  < > = values in this interval not displayed.  CBC  Recent Labs Lab 01/29/2016 0658 01/11/2016 1317 01/22/16 0305 01/22/16 0747  WBC 10.4 12.3* 11.2*  --   HGB 15.0 12.8 13.6 14.6  HCT 46.7* 41.1 41.7 43.0  PLT 262 195 113*  --     Coag's  Recent Labs Lab February 07, 2016 0825 Feb 07, 2016 1317 07-Feb-2016 2120  APTT INR 1.27 1.22 1.27    Sepsis Markers  Recent Labs Lab 2016-02-07 0911  2016-02-07 2030 07-Feb-2016 2350 01/22/16 0305  LATICACIDVEN  --   < > 1.0 0.9 1.5  PROCALCITON <0.10  --   --   --  0.57  < > = values in this interval not displayed.  ABG  Recent Labs Lab 02/07/16 0941 02-07-16 1605 February 07, 2016 1725  PHART  7.272* 7.188* 7.407  PCO2ART 38.1 52.3* 22.2*  PO2ART OUTSIDE REPORTABLE RANGE 219.0* 145.0*    Liver Enzymes  Recent Labs Lab 02-07-2016 0658  AST 39  ALT 33  ALKPHOS 207*  BILITOT 1.8*  ALBUMIN 4.4    Cardiac Enzymes  Recent Labs Lab 2016/02/07 1746 Feb 07, 2016 2035 2016/02/07 2320  TROPONINI 0.23* 0.13* 0.10*    Glucose  Recent Labs Lab 01/22/16 0203 01/22/16 0303 01/22/16 0401 01/22/16 0504 01/22/16 0610 01/22/16 0700  GLUCAP 154* 138* 128* 144* 149* 170*    Imaging Ct Head Wo Contrast  07-Feb-2016  CLINICAL DATA:  Altered mental status with seizure. Recent cardiopulmonary arrest EXAM: CT HEAD WITHOUT CONTRAST TECHNIQUE: Contiguous axial images were obtained from the base of the skull through the vertex without intravenous contrast. COMPARISON:  None. FINDINGS: The ventricles are normal in size and configuration. There is no intracranial mass hemorrhage, extra-axial fluid collection, or midline shift. The gray-white compartments are normal. There is no acute infarct evident. Gray-white differentiation appears normal. Bony calvarium appears intact. The mastoid air cells are clear. No intraorbital lesions are identified. There is mucosal thickening in the maxillary antra bilaterally as well as in several ethmoid air cells bilaterally. IMPRESSION: Mild paranasal sinus disease. No intracranial mass, hemorrhage, or edema. No acute infarct evident. Electronically Signed   By: Bretta Bang III M.D.   On: 02-07-2016 10:15   Ct Angio Chest Pe W/cm &/or Wo Cm  07-Feb-2016  CLINICAL DATA:  Shortness of breath/respiratory failure EXAM: CT ANGIOGRAPHY CHEST WITH CONTRAST TECHNIQUE: Multidetector CT imaging of the chest was performed using the standard protocol during bolus administration of intravenous contrast. Multiplanar CT image reconstructions and MIPs were obtained to evaluate the vascular anatomy. CONTRAST:  OMNIPAQUE IOHEXOL 350 MG/ML SOLN COMPARISON:  Chest radiograph  02/07/2016 FINDINGS: Mediastinum/Lymph Nodes: There is no demonstrable pulmonary embolus. There is no thoracic aortic aneurysm or dissection. The visualized great vessels appear unremarkable. There are foci of coronary artery calcification. The pericardium is not thickened. Visualized thyroid appears normal. There is no appreciable thoracic adenopathy. Lungs/Pleura: There is patchy atelectasis in both lower lobes with lesser atelectasis noted throughout the lungs bilaterally. There is focal airspace consolidation in superior and medial segments of the left lower lobe concerning for pneumonia in these areas. Endotracheal tube tip is above the carina. No pneumothorax. Upper abdomen: Nasogastric tube extends into the stomach and loops back on itself with the tip in the distal esophagus. Visualized upper abdominal structures otherwise appear unremarkable. Musculoskeletal: There are foci of degenerative change in the thoracic spine. There are no blastic or lytic bone lesions. Review of the MIP images confirms the above findings. IMPRESSION: No demonstrable pulmonary embolus. Focal consolidation concerning for pneumonia in portions of the superior and medial segments of the left lower lobe. There is also bibasilar atelectasis. Nasogastric tube extends into the stomach where it loops back upon itself with the tip in the  distal esophagus. No demonstrable adenopathy. These results will be called to the ordering clinician or representative by the Radiologist Assistant, and communication documented in the PACS or zVision Dashboard. Electronically Signed   By: Bretta Bang III M.D.   On: 02-Feb-2016 10:29   Dg Chest Port 1 View  2016-02-02  CLINICAL DATA:  Central line placement and shortness of breath EXAM: PORTABLE CHEST 1 VIEW COMPARISON:  2016-02-02 FINDINGS: Endotracheal tube tip is 2.7 cm above the carina. There is a left internal jugular central line with tip over the superior vena cava. Mild cardiac  enlargement persists. There is now bilateral perihilar and lower lobe opacity. Possible small left pleural effusion. IMPRESSION: Bilateral perihilar infiltrates concerning for pulmonary edema. Bilateral pneumonia not excluded. Lines and tubes as described above. Electronically Signed   By: Esperanza Heir M.D.   On: Feb 02, 2016 18:11     STUDIES:  Port CXR 3/19:  Personally reviewed by me. ETT too high. No focal opacity or effusion. EKG 3/19:  Sinus tachycardia. QTc . LAE. ST segment depression in lateral leads.  CULTURES: MRSA PCR 3/19>>>  ANTIBIOTICS: None.  SIGNIFICANT EVENTS: 3/01 - Discharge after hospitalization for alcoholic pancreatitis & hypertensive urgency  LINES/TUBES: OETT 7.0 3/19>>> OGT 3/19>>> FOLEY 3/19>>> PIV x2  ASSESSMENT / PLAN:  PULMONARY A: Acute Respiratory Failure - Post arrest.  P:   Full Vent Support Holding on SBT until/if mental status improves CTA chest neg for PE, positive for pulmonary infiltrate. Zosyn.  CARDIOVASCULAR A:  Cardiac Arrest H/O HTN  P:  Telemetry monitoring Vitals per unit Protocol ASA Cardiology Consult on arrival to [facility] TTE per cards Holding outpatient Lisinopril & Lopressor Rewarm at 5:15 PM on 3/20.  RENAL A:   No acute issue.  P:   Monitor renal function w/ Foley Trending BUN/Creatinine & Electrolytes daily  GASTROINTESTINAL A:   H/O Pancreatitis - Secondary to EtOH. Lipase normal.  P:   NPO while paralyzed. Protonix IV daily TF post rewarming.  HEMATOLOGIC A:   No acute issues.  P:  Trend cell counts daily w/ CBC SCDs Heparin Brown q8hr  INFECTIOUS A:   No acute issue.  P:   Monitor for signs of infection/leukocytosis  ENDOCRINE A:   H/O DM Type II - A1c  6.7 on 3/2.  P:   Accu-Checks q4hr while NPO SSI per low dose algorithm Holding outpatient Metformin  NEUROLOGIC A:   Possible Seizure - During IV Dilaudid push in ED. Sedation on Ventilator H/O Depression  P:   RASS  goal: 0 to -1 (when not on paralytics) Versed IV drip Fentanyl IV drip Cis drip. EEG pending, to be done 3/20. CT Head w/o noted Holding outpatient Prozac & Wellbutrin Will consult neurology in AM post rewarming.  FAMILY  - Updates:  No family bedside.  - Inter-disciplinary family meet or Palliative Care meeting due by:  3/26  The patient is critically ill with multiple organ systems failure and requires high complexity decision making for assessment and support, frequent evaluation and titration of therapies, application of advanced monitoring technologies and extensive interpretation of multiple databases.   Critical Care Time devoted to patient care services described in this note is  35  Minutes. This time reflects time of care of this signee Dr Koren Bound. This critical care time does not reflect procedure time, or teaching time or supervisory time of PA/NP/Med student/Med Resident etc but could involve care discussion time.  Alyson Reedy, M.D. [facility] Pulmonary/Critical Care Medicine. Pager: [phone].  After hours pager: [phone].

## 2016-01-22 NOTE — Progress Notes (Signed)
eLink Physician-Brief Progress Note Patient Name: Asencion GowdaLeslie A Piccirilli DOB: 08/12/1961 MRN: [id number]   Date of Service  01/22/2016  HPI/Events of Note  Postarrest and cooling with hypokalemia, hypomagnesemia, & hypophosphatemia.  eICU Interventions  1. Potassium chloride 10 mEq IV 4 Runs 2. Magnesium sulfate 2 Gram IV 3. Potassium phosphorus 30 mmol IV     Intervention Category Intermediate Interventions: Electrolyte abnormality - evaluation and management  Lawanda CousinsJennings Masaji Billups 01/22/2016, 4:07 AM

## 2016-01-23 ENCOUNTER — Inpatient Hospital Stay (HOSPITAL_COMMUNITY): Payer: BLUE CROSS/BLUE SHIELD

## 2016-01-23 DIAGNOSIS — R570 Cardiogenic shock: Secondary | ICD-10-CM | POA: Insufficient documentation

## 2016-01-23 LAB — BLOOD GAS, ARTERIAL
ACID-BASE DEFICIT: 8.5 mmol/L — AB (ref 0.0–2.0)
Bicarbonate: 15.3 mEq/L — ABNORMAL LOW (ref 20.0–24.0)
DRAWN BY: 44956
FIO2: 0.4
MECHVT: 450 mL
O2 SAT: 99 %
PCO2 ART: 24.7 mmHg — AB (ref 35.0–45.0)
PEEP/CPAP: 5 cmH2O
PH ART: 7.408 (ref 7.350–7.450)
PO2 ART: 147 mmHg — AB (ref 80.0–100.0)
Patient temperature: 98.6
RATE: 24 resp/min
TCO2: 16 mmol/L (ref 0–100)

## 2016-01-23 LAB — CBC
HEMATOCRIT: 38.9 % (ref 36.0–46.0)
Hemoglobin: 12.4 g/dL (ref 12.0–15.0)
MCH: 26.3 pg (ref 26.0–34.0)
MCHC: 31.9 g/dL (ref 30.0–36.0)
MCV: 82.6 fL (ref 78.0–100.0)
PLATELETS: 142 10*3/uL — AB (ref 150–400)
RBC: 4.71 MIL/uL (ref 3.87–5.11)
RDW: 15.3 % (ref 11.5–15.5)
WBC: 11.2 10*3/uL — ABNORMAL HIGH (ref 4.0–10.5)

## 2016-01-23 LAB — BASIC METABOLIC PANEL
ANION GAP: 10 (ref 5–15)
Anion gap: 13 (ref 5–15)
BUN: 10 mg/dL (ref 6–20)
BUN: 10 mg/dL (ref 6–20)
CALCIUM: 8.9 mg/dL (ref 8.9–10.3)
CHLORIDE: 114 mmol/L — AB (ref 101–111)
CO2: 15 mmol/L — ABNORMAL LOW (ref 22–32)
CO2: 15 mmol/L — AB (ref 22–32)
CREATININE: 0.67 mg/dL (ref 0.44–1.00)
Calcium: 8.6 mg/dL — ABNORMAL LOW (ref 8.9–10.3)
Chloride: 112 mmol/L — ABNORMAL HIGH (ref 101–111)
Creatinine, Ser: 0.78 mg/dL (ref 0.44–1.00)
GFR calc Af Amer: >60 mL/min (ref >60)
GFR calc Af Amer: >60 mL/min (ref >60)
GFR calc non Af Amer: >60 mL/min (ref >60)
GLUCOSE: 145 mg/dL — AB (ref 65–99)
Glucose, Bld: 153 mg/dL — ABNORMAL HIGH (ref 65–99)
POTASSIUM: 3.5 mmol/L (ref 3.5–5.1)
Potassium: 3.4 mmol/L — ABNORMAL LOW (ref 3.5–5.1)
SODIUM: 140 mmol/L (ref 135–145)
Sodium: 139 mmol/L (ref 135–145)

## 2016-01-23 LAB — MAGNESIUM: Magnesium: 1.9 mg/dL (ref 1.7–2.4)

## 2016-01-23 LAB — GLUCOSE, CAPILLARY
GLUCOSE-CAPILLARY: 118 mg/dL — AB (ref 65–99)
GLUCOSE-CAPILLARY: 127 mg/dL — AB (ref 65–99)
GLUCOSE-CAPILLARY: 144 mg/dL — AB (ref 65–99)
Glucose-Capillary: 142 mg/dL — ABNORMAL HIGH (ref 65–99)
Glucose-Capillary: 136 mg/dL — ABNORMAL HIGH (ref 65–99)
Glucose-Capillary: 134 mg/dL — ABNORMAL HIGH (ref 65–99)
Glucose-Capillary: 132 mg/dL — ABNORMAL HIGH (ref 65–99)

## 2016-01-23 LAB — PROCALCITONIN: Procalcitonin: 0.74 ng/mL

## 2016-01-23 LAB — PHOSPHORUS: Phosphorus: 4.7 mg/dL — ABNORMAL HIGH (ref 2.5–4.6)

## 2016-01-23 MED ORDER — POTASSIUM CHLORIDE 20 MEQ/15ML (10%) PO SOLN
40.0000 meq | Freq: Three times a day (TID) | ORAL | Status: AC
  Administered 2016-01-23 (×2): 40 meq
  Filled 2016-01-23 (×2): qty 30

## 2016-01-23 MED ORDER — METOPROLOL TARTRATE 1 MG/ML IV SOLN
5.0000 mg | Freq: Once | INTRAVENOUS | Status: AC
  Administered 2016-01-23: 5 mg via INTRAVENOUS
  Filled 2016-01-23: qty 5

## 2016-01-23 MED ORDER — VITAL HIGH PROTEIN PO LIQD
1000.0000 mL | ORAL | Status: DC
  Administered 2016-01-23 – 2016-01-24 (×2): 1000 mL
  Administered 2016-01-24 (×2)
  Administered 2016-01-24 (×2): 1000 mL
  Administered 2016-01-24: 04:00:00
  Administered 2016-01-24: 1000 mL
  Administered 2016-01-24 (×2)
  Administered 2016-01-24 – 2016-01-25 (×5): 1000 mL

## 2016-01-23 NOTE — Progress Notes (Signed)
Nutrition Follow-up  DOCUMENTATION CODES:   Obesity unspecified  INTERVENTION:   Initiate Vital High Protein @ 20 ml/hr via OGT and increase by 10 ml every 4 hours to goal rate of 55 ml/hr.   Tube feeding regimen provides 1320 kcal (100% of needs), 116 grams of protein, and 1104 ml of H2O.   NUTRITION DIAGNOSIS:   Inadequate oral intake related to inability to eat as evidenced by NPO status.  Ongoing  GOAL:   Provide needs based on ASPEN/SCCM guidelines  Unmet  MONITOR:   Vent status, Labs, Weight trends, TF tolerance, I & O's, Skin  REASON FOR ASSESSMENT:   Consult Enteral/tube feeding initiation and management  ASSESSMENT:   Patient was discharged on 3/1 from hospital after being treated for alcoholic pancreatitis. At presentation nurse report documented shows she began having abdominal pain on Friday evening with nausea and vomiting. The emesis had progressed. During IV push of Dilaudid patient began having seizure-like activity. Emergency department physician was contacted and convulsions lasted approximately 1 minute  Patient remains intubated on ventilator support. OGT in place. MV: 10.6 L/min Temp (24hrs), Avg:95 F (35 C), Min:90.7 F (32.6 C), Max:99.1 F (37.3 C)  Propofol: n/a  Spoke with pt's friend and POA at bedside. She reports that pt had a good appetite PTA. She reports pt had hx of distant wt loss after being hospitalized for an infection, however, pt has since gained most of the weight back.   Nutrition-Focused physical exam completed. Findings are no fat depletion, no muscle depletion, and no edema.   Case dicussed with RN. Pt has been rewarmed, but continues to have pads on to help maintain body temperature. He confirmed plans to start TF today; RD received consult to initiate/manage TF.   Labs reviewed.   Diet Order:  Diet NPO time specified  Skin:  Reviewed, no issues  Last BM:  PTA  Height:   Ht Readings from Last 1 Encounters:   01/20/2016 5\' 4"  (1.626 m)    Weight:   Wt Readings from Last 1 Encounters:  01/23/16 196 lb 2.6 oz (88.98 kg)    Ideal Body Weight:  54.5 kg  BMI:  Body mass index is 33.66 kg/(m^2).  Estimated Nutritional Needs:   Kcal:  [id number]  Protein:  >109 grams  Fluid:  >1.2 L  EDUCATION NEEDS:   No education needs identified at this time  Teodoro Jeffreys A. Mayford KnifeWilliams, RD, LDN, CDE Pager: [phone] After hours Pager: [phone]

## 2016-01-23 NOTE — Consult Note (Signed)
NEURO HOSPITALIST CONSULT NOTE   Requestig physician: Dr. Jamison Neighbor   Reason for Consult: Prognostication   History obtained from: Chart   HPI:                                                                                                                                          Alyssa Garcia is an 55 y.o. female Patient was discharged on 3/1 from hospital after being treated for alcoholic pancreatitis. At presentation nurse report documented shows she began having abdominal pain on Friday evening with nausea and vomiting. The emesis had progressed. During IV push of Dilaudid patient began having seizure-like activity. Emergency department physician was contacted and convulsions lasted approximately 1 minute. This progressed to a stair and slow rhythm. Respirations became agonal and patient became pulseless. CPR was initiated. Per documentation patient initially was in coarse V. fib and shock was administered. Patient was subsequently found to be in pulseless electrical activity. Epinephrine was administered twice as well as 150 mg of amiodarone. she was shocked x 1. Eventually had ROSC. She was intubated and started on hypothermia protocol. EF appears to be about 20% with diffuse hypokinesis.  Patient was rewarmed Today at 0400 (8 hours ago).   Neurology was asked for prognostication.   Past Medical History  Diagnosis Date  . Depression   . Hyperlipidemia   . Diabetes mellitus without complication (HCC)   . Pancreatitis     Secondary to EtOH    Past Surgical History  Procedure Laterality Date  . Knee surgery      Family History  Problem Relation Age of Onset  . Rheum arthritis Mother   . COPD Sister       Social History:  reports that she has never smoked. She does not have any smokeless tobacco history on file. She reports that she drinks alcohol. She reports that she does not use illicit drugs.  No Known Allergies  MEDICATIONS:                                                                                                                      Prior to Admission:  Prescriptions prior to admission  Medication Sig Dispense Refill Last Dose  . buPROPion (WELLBUTRIN SR) 150 MG 12 hr tablet Take  150 mg by mouth 2 (two) times daily.  3 01/20/2016 at Unknown time  . FLUoxetine (PROZAC) 20 MG capsule Take 60 mg by mouth every morning.  3 01/20/2016 at Unknown time  . LANTUS SOLOSTAR 100 UNIT/ML Solostar Pen Inject 10-12 Units into the skin 2 (two) times daily. 12 units every morning and 10 every night  5 01/20/2016 at Unknown time  . lisinopril (PRINIVIL,ZESTRIL) 40 MG tablet Take 40 mg by mouth daily.  2 01/20/2016 at Unknown time  . metFORMIN (GLUCOPHAGE) 1000 MG tablet Take 1,000 mg by mouth 2 (two) times daily with a meal.   01/20/2016 at Unknown time  . metoprolol (LOPRESSOR) 100 MG tablet Take 1 tablet (100 mg total) by mouth 2 (two) times daily. 60 tablet 0 01/20/2016 at 2200  . omeprazole (PRILOSEC OTC) 20 MG tablet Take 20 mg by mouth daily.   01/20/2016 at Unknown time   Scheduled: . antiseptic oral rinse  7 mL Mouth Rinse 10 times per day  . artificial tears  1 application Both Eyes 3 times per day  . aspirin  81 mg Per Tube Daily  . chlorhexidine gluconate  15 mL Mouth Rinse BID  . heparin  5,000 Units Subcutaneous 3 times per day  . insulin aspart  0-9 Units Subcutaneous 6 times per day  . levETIRAcetam  1,000 mg Intravenous BID  . pantoprazole (PROTONIX) IV  40 mg Intravenous QHS  . piperacillin-tazobactam (ZOSYN)  IV  3.375 g Intravenous 3 times per day  . pneumococcal 23 valent vaccine  0.5 mL Intramuscular Tomorrow-1000  . potassium chloride  40 mEq Per Tube TID  . sodium chloride flush  10-40 mL Intracatheter Q12H     ROS:                                                                                                                                       History obtained from unobtainable from patient due to mental  status    Blood pressure 123/62, pulse 120, temperature 99.1 F (37.3 C), temperature source Core (Comment), resp. rate 25, height  (1.626 m), weight 88.98 kg (196 lb 2.6 oz), SpO2 100 %.   Neurologic Examination:                                                                                                      HEENT-  Normocephalic, no lesions, without obvious abnormality.  Normal external eye and conjunctiva.  Normal TM's bilaterally.  Normal auditory  canals and external ears. Normal external nose, mucus membranes and septum.  Normal pharynx. Cardiovascular- S1, S2 normal, pulses palpable throughout   Lungs- chest clear, no wheezing, rales, normal symmetric air entry Abdomen- normal findings: bowel sounds normal Extremities- no edema Lymph-no adenopathy palpable Musculoskeletal-no joint tenderness, deformity or swelling Skin-warm and dry, no hyperpigmentation, vitiligo, or suspicious lesions  Neurological Examination Mental Status: Patient does not respond to verbal stimuli.  Does not respond to deep sternal rub.  Does not follow commands.  No verbalizations noted. Not breathing over vent, intubated.  Cranial Nerves: II: patient does not respond confrontation bilaterally, pupils right 2 mm, left 2 mm,and reactive bilaterally III,IV,VI: doll's response present bilaterally. V,VII: corneal reflex present bilaterally  VIII: patient does not respond to verbal stimuli IX,X: gag reflex present, XI: trapezius strength unable to test bilaterally XII: tongue strength unable to test Motor: Extremities flaccid throughout.  Minimal withdrawal movements in UE and some in LE bilaterally.  Sensory: As above. Deep Tendon Reflexes:  Absent throughout. Plantars: equivocal bilaterally Cerebellar: Unable to perform  Lab Results: Basic Metabolic Panel:  Recent Labs Lab 08/01/16 0911  01/22/16 0305  01/22/16 1119 01/22/16 1559 01/22/16 1940 01/23/16 0042 01/23/16 0350  NA  --   <  > 139  < > 139 140 140 140 139  K  --   < > 2.9*  < > 4.2 3.1* 3.1* 3.4* 3.5  CL  --   < > 112*  < > 112* 114* 113* 112* 114*  CO2  --   < > 15*  --  16* 17* 15* 15* 15*  GLUCOSE  --   < > 160*  < > 190* 152* 118* 153* 145*  BUN  --   < > 9  < > 8 8 10 10 10   CREATININE  --   < > 0.54  < > 0.51 0.65 0.57 0.67 0.78  CALCIUM  --   < > 8.7*  --  8.7* 8.9 8.8* 8.9 8.6*  MG 1.4*  --  1.7  --   --   --  2.2  --  1.9  PHOS 3.7  --  1.7*  --   --   --  4.0  --  4.7*  < > = values in this interval not displayed.  Liver Function Tests:  Recent Labs Lab 08/01/16 0658  AST 39  ALT 33  ALKPHOS 207*  BILITOT 1.8*  PROT 7.6  ALBUMIN 4.4    Recent Labs Lab 08/01/16 0658 08/01/16 0911  LIPASE 16  --   AMYLASE  --  61   No results for input(s): AMMONIA in the last 168 hours.  CBC:  Recent Labs Lab 08/01/16 0658 08/01/16 1317 01/22/16 0305 01/22/16 0747 01/23/16 0350  WBC 10.4 12.3* 11.2*  --  11.2*  NEUTROABS  --  10.0*  --   --   --   HGB 15.0 12.8 13.6 14.6 12.4  HCT 46.7* 41.1 41.7 43.0 38.9  MCV 85.2 85.6 83.9  --  82.6  PLT 262 195 113*  --  142*    Cardiac Enzymes:  Recent Labs Lab 08/01/16 1317 08/01/16 1746 08/01/16 2035 08/01/16 2320  TROPONINI 0.42* 0.23* 0.13* 0.10*    Lipid Panel:  Recent Labs Lab 01/22/16 0305  TRIG 135    CBG:  Recent Labs Lab 01/22/16 1602 01/22/16 1929 01/23/16 0021 01/23/16 0350 01/23/16 0849  GLUCAP 142* 111* 144* 136* 127*    Microbiology: Results for orders placed  or performed during the hospital encounter of 02-06-16  MRSA PCR Screening     Status: None   Collection Time: 02-06-2016 11:40 AM  Result Value Ref Range Status   MRSA by PCR NEGATIVE NEGATIVE Final    Comment:        The GeneXpert MRSA Assay (FDA approved for NASAL specimens only), is one component of a comprehensive MRSA colonization surveillance program. It is not intended to diagnose MRSA infection nor to guide or monitor treatment  for MRSA infections.     Coagulation Studies:  Recent Labs  Feb 06, 2016 0825 February 06, 2016 1317 06-Feb-2016 2120  LABPROT 16.1* 15.6* 16.0*  INR 1.27 1.22 1.27    Imaging: Dg Chest Port 1 View  01/23/2016  CLINICAL DATA:  Shortness of breath. EXAM: PORTABLE CHEST 1 VIEW COMPARISON:  02/06/16 . FINDINGS: Endotracheal tube, NG tube, left IJ line stable position. Heart size stable. Bibasilar pulmonary infiltrates, slightly improved from prior exam noted. Small left pleural effusion. No pneumothorax. IMPRESSION: 1. Lines and tubes in stable position. 2. Slight improvement of bibasilar pulmonary infiltrates. Small left pleural effusion again noted. Electronically Signed   By: Maisie Fus  Register   On: 01/23/2016 07:17   Dg Chest Port 1 View  02/06/16  CLINICAL DATA:  Central line placement and shortness of breath EXAM: PORTABLE CHEST 1 VIEW COMPARISON:  02/06/16 FINDINGS: Endotracheal tube tip is 2.7 cm above the carina. There is a left internal jugular central line with tip over the superior vena cava. Mild cardiac enlargement persists. There is now bilateral perihilar and lower lobe opacity. Possible small left pleural effusion. IMPRESSION: Bilateral perihilar infiltrates concerning for pulmonary edema. Bilateral pneumonia not excluded. Lines and tubes as described above. Electronically Signed   By: Esperanza Heir M.D.   On: 02/06/16 18:11    Felicie Morn PA-C [facility] [phone] 01/23/2016, 11:32 AM   Assessment: 1. 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  2. Unable to provide accurate prognosis while on hypothermia protocol.  3. EEG demonstrated no focal, hemispheric, or lateralizing features.There was no epileptiform activity recorded. The EEG was fairly flat, with no occipital dominant rhythm, consistent with a sedated EEG.   Recommendations: 1. MRI brain when stable.  2. Continue Keppra.  3. Repeat EEG when the patient is off of  sedation and euthermic.   Caryl Pina, MD

## 2016-01-23 NOTE — Progress Notes (Signed)
Patient Name:  Alyssa Garcia, DOB: 1960/12/14, MRN: [id number] Primary Doctor: Pcp Not In System Primary Cardiologist:   Date: 01/23/2016   SUBJECTIVE:  Cooling has been turned off. EEG done yesterday consistent with sedated state. Follow-up EEG is to be considered. Bradycardia has normalized to normal sinus rhythm with the cooling off period. Blood pressure has been low and we've affected continued. Input was greater than output yesterday. CVP is low. Patient is not receiving diuretics.   Past Medical History  Diagnosis Date  . Depression   . Hyperlipidemia   . Diabetes mellitus without complication (HCC)   . Pancreatitis     Secondary to EtOH   Filed Vitals:   01/23/16 0425 01/23/16 0500 01/23/16 0600 01/23/16 0700  BP:      Pulse:   102   Temp:  97.3 F (36.3 C) 98.1 F (36.7 C) 98.4 F (36.9 C)  TempSrc:  Core (Comment) Core (Comment) Core (Comment)  Resp:  24 24   Height:      Weight: 196 lb 2.6 oz (88.98 kg)     SpO2:   100%     Intake/Output Summary (Last 24 hours) at 01/23/16 0725 Last data filed at 01/23/16 0600  Gross per 24 hour  Intake 2133.65 ml  Output    520 ml  Net 1613.65 ml   Filed Weights   01-Feb-2016 1215 01/22/16 0500 01/23/16 0425  Weight: 191 lb 2.2 oz (86.7 kg) 195 lb 11.6 oz (88.78 kg) 196 lb 2.6 oz (88.98 kg)     LABS: Basic Metabolic Panel:  Recent Labs  40/98/11 1940 01/23/16 0042 01/23/16 0350  NA 140 140 139  K 3.1* 3.4* 3.5  CL 113* 112* 114*  CO2 15* 15* 15*  GLUCOSE 118* 153* 145*  BUN 10 10 10   CREATININE 0.57 0.67 0.78  CALCIUM 8.8* 8.9 8.6*  MG 2.2  --  1.9  PHOS 4.0  --  4.7*   Liver Function Tests:  Recent Labs  01-Feb-2016 0658  AST 39  ALT 33  ALKPHOS 207*  BILITOT 1.8*  PROT 7.6  ALBUMIN 4.4    Recent Labs  02-01-2016 0658 Feb 01, 2016 0911  LIPASE 16  --   AMYLASE  --  61   CBC:  Recent Labs  02/01/2016 1317 01/22/16 0305 01/22/16 0747 01/23/16 0350  WBC 12.3* 11.2*  --  11.2*  NEUTROABS  10.0*  --   --   --   HGB 12.8 13.6 14.6 12.4  HCT 41.1 41.7 43.0 38.9  MCV 85.6 83.9  --  82.6  PLT 195 113*  --  142*   Cardiac Enzymes:  Recent Labs  Feb 01, 2016 1746 Feb 01, 2016 2035 02/01/2016 2320  TROPONINI 0.23* 0.13* 0.10*   BNP: Invalid input(s): POCBNP D-Dimer:  Recent Labs  02/01/16 0807  DDIMER 2.42*   Thyroid Function Tests: No results for input(s): TSH, T4TOTAL, T3FREE, THYROIDAB in the last 72 hours.  Invalid input(s): FREET3  RADIOLOGY: Ct Head Wo Contrast  2016-02-01  CLINICAL DATA:  Altered mental status with seizure. Recent cardiopulmonary arrest EXAM: CT HEAD WITHOUT CONTRAST TECHNIQUE: Contiguous axial images were obtained from the base of the skull through the vertex without intravenous contrast. COMPARISON:  None. FINDINGS: The ventricles are normal in size and configuration. There is no intracranial mass hemorrhage, extra-axial fluid collection, or midline shift. The gray-white compartments are normal. There is no acute infarct evident. Gray-white differentiation appears normal. Bony calvarium appears intact. The mastoid air cells are  clear. No intraorbital lesions are identified. There is mucosal thickening in the maxillary antra bilaterally as well as in several ethmoid air cells bilaterally. IMPRESSION: Mild paranasal sinus disease. No intracranial mass, hemorrhage, or edema. No acute infarct evident. Electronically Signed   By: Bretta Bang III M.D.   On: 2016/02/14 10:15   Ct Angio Chest Pe W/cm &/or Wo Cm  14-Feb-2016  CLINICAL DATA:  Shortness of breath/respiratory failure EXAM: CT ANGIOGRAPHY CHEST WITH CONTRAST TECHNIQUE: Multidetector CT imaging of the chest was performed using the standard protocol during bolus administration of intravenous contrast. Multiplanar CT image reconstructions and MIPs were obtained to evaluate the vascular anatomy. CONTRAST:  OMNIPAQUE IOHEXOL 350 MG/ML SOLN COMPARISON:  Chest radiograph 14-Feb-2016 FINDINGS:  Mediastinum/Lymph Nodes: There is no demonstrable pulmonary embolus. There is no thoracic aortic aneurysm or dissection. The visualized great vessels appear unremarkable. There are foci of coronary artery calcification. The pericardium is not thickened. Visualized thyroid appears normal. There is no appreciable thoracic adenopathy. Lungs/Pleura: There is patchy atelectasis in both lower lobes with lesser atelectasis noted throughout the lungs bilaterally. There is focal airspace consolidation in superior and medial segments of the left lower lobe concerning for pneumonia in these areas. Endotracheal tube tip is above the carina. No pneumothorax. Upper abdomen: Nasogastric tube extends into the stomach and loops back on itself with the tip in the distal esophagus. Visualized upper abdominal structures otherwise appear unremarkable. Musculoskeletal: There are foci of degenerative change in the thoracic spine. There are no blastic or lytic bone lesions. Review of the MIP images confirms the above findings. IMPRESSION: No demonstrable pulmonary embolus. Focal consolidation concerning for pneumonia in portions of the superior and medial segments of the left lower lobe. There is also bibasilar atelectasis. Nasogastric tube extends into the stomach where it loops back upon itself with the tip in the distal esophagus. No demonstrable adenopathy. These results will be called to the ordering clinician or representative by the Radiologist Assistant, and communication documented in the PACS or zVision Dashboard. Electronically Signed   By: Bretta Bang III M.D.   On: 02-14-2016 10:29   Ct Abdomen Pelvis W Contrast  12/31/2015  CLINICAL DATA:  Mid abdominal pain with nausea vomiting and diarrhea. Elevated serum lipase level EXAM: CT ABDOMEN AND PELVIS WITH CONTRAST TECHNIQUE: Multidetector CT imaging of the abdomen and pelvis was performed using the standard protocol following bolus administration of intravenous  contrast. CONTRAST:  OMNIPAQUE IOHEXOL 300 MG/ML SOLN, 25mL OMNIPAQUE IOHEXOL 300 MG/ML SOLN COMPARISON:  12/31/2015 ; report dated 02/06/2000 FINDINGS: Lower chest:  Mild cardiomegaly. Hepatobiliary: Mild diffuse hepatic steatosis. Pancreas: Diffuse peripancreatic stranding compatible with acute pancreatitis. No findings of necrosis, abscess, or pseudocyst. Spleen: Unremarkable Adrenals/Urinary Tract: Unremarkable Stomach/Bowel: Unremarkable Vascular/Lymphatic: Upper peripancreatic lymph node 1.5 cm in short axis on image 22 series 2. Portacaval node 1.3 cm in short axis, image 24 series 2. Porta hepatis node 1.3 cm in short axis, image 27 series 2. These lymph nodes are likely reactive. Reproductive: Unremarkable Other: Small amount of perihepatic and perisplenic ascites. Trace fluid in the paracolic gutters. Musculoskeletal: Lower lumbar spondylosis and degenerative disc disease. A transitional lumbosacral vertebra is assumed to represent the S1 level. Careful correlation with this numbering strategy prior to any procedural intervention would be recommended. There is potentially prominent central narrowing of the thecal sac at the L4-5 level especially eccentric to the left due to disc bulge and disc protrusion along with at least mild left foraminal stenosis at  this level. There is also intervertebral spurring at the L5-S1 level. Infraumbilical hernia contains adipose tissue. IMPRESSION: 1. Acute pancreatitis. No pancreatic abscess, pseudocyst, or necrosis. Peripancreatic stranding observed with a small amount of perihepatic and perisplenic ascites. Surrounding porta hepatis reactive lymph nodes. 2. Lumbar spondylosis and degenerative disc disease potentially with considerable impingement at the L4-5 level (please note that S1 is transitional). 3. Infraumbilical hernia contains adipose tissue. 4. Mild cardiomegaly. 5. Mild diffuse hepatic steatosis. Electronically Signed   By: Gaylyn Rong M.D.   On:  12/31/2015 17:04   Dg Chest Port 1 View  01/23/2016  CLINICAL DATA:  Shortness of breath. EXAM: PORTABLE CHEST 1 VIEW COMPARISON:  2016-02-02 . FINDINGS: Endotracheal tube, NG tube, left IJ line stable position. Heart size stable. Bibasilar pulmonary infiltrates, slightly improved from prior exam noted. Small left pleural effusion. No pneumothorax. IMPRESSION: 1. Lines and tubes in stable position. 2. Slight improvement of bibasilar pulmonary infiltrates. Small left pleural effusion again noted. Electronically Signed   By: Maisie Fus  Register   On: 01/23/2016 07:17   Dg Chest Port 1 View  02/02/2016  CLINICAL DATA:  Central line placement and shortness of breath EXAM: PORTABLE CHEST 1 VIEW COMPARISON:  02-02-2016 FINDINGS: Endotracheal tube tip is 2.7 cm above the carina. There is a left internal jugular central line with tip over the superior vena cava. Mild cardiac enlargement persists. There is now bilateral perihilar and lower lobe opacity. Possible small left pleural effusion. IMPRESSION: Bilateral perihilar infiltrates concerning for pulmonary edema. Bilateral pneumonia not excluded. Lines and tubes as described above. Electronically Signed   By: Esperanza Heir M.D.   On: Feb 02, 2016 18:11   Dg Chest Portable 1 View  02-02-2016  CLINICAL DATA:  Hypoxia EXAM: PORTABLE CHEST 1 VIEW COMPARISON:  December 31, 2015 FINDINGS: Endotracheal tube tip is 5.2 cm above the carina. No pneumothorax. No edema or consolidation. Heart is upper normal in size with pulmonary vascularity within normal limits. No adenopathy. No bone lesions. IMPRESSION: Endotracheal tube as described without pneumothorax. No edema or consolidation. Stable cardiac silhouette. Electronically Signed   By: Bretta Bang III M.D.   On: 2016-02-02 08:39   Dg Abd Acute W/chest  12/31/2015  CLINICAL DATA:  Patient with diffuse abdominal pain, nausea and vomiting. EXAM: DG ABDOMEN ACUTE W/ 1V CHEST COMPARISON:  None. FINDINGS: Normal cardiac  and mediastinal contours. No consolidative pulmonary opacities. No pleural effusion or pneumothorax. Gas is demonstrated within nondilated loops of large and small bowel in a nonobstructed pattern. No free intraperitoneal air. Unremarkable osseous skeleton. IMPRESSION: No acute cardiopulmonary process. Nonobstructed bowel gas pattern. Electronically Signed   By: Annia Belt M.D.   On: 12/31/2015 12:05    PHYSICAL EXAM  patient is intubated. Sedation has been turned off. She is not responding at this time. Lungs reveal scattered rhonchi. Cardiac exam reveals an S1 and S2. There is no peripheral edema.   TELEMETRY:  I have personally reviewed telemetry today January 23, 2016. There is normal sinus rhythm.   ASSESSMENT AND PLAN:  55 yo with history of ETOH abuse, ETOH pancreatitis, type II diabetes is seen after cardiac arrest. She was just discharged on 3/1 from hospitalization for ETOH pancreatitis.  1. Cardiac arrest: Occurred in the setting of nausea/vomiting and an apparent seizure after getting Dilaudid. Initial rhythm was slow PEA but had vfib at one point. It is possible that the initial PEA arrest was triggered by aspiration in the setting of vomiting and seizing. Continued hypothermic protocol  with cooling now turned off. Slight troponin elevation trending down. Patient was not taken to the cath lab acutely. Suspicion for aspiration as trigger for PEA event and do not know what the GI process is that was causing the nausea/vomiting and abdominal pain.   - Echo showed EF 20% with diffuse hypokinesis. Uncertain etiology => could be due to heavy ETOH history or stunning from arrest. Also could be ischemic cardiomyopathy. She will need cardiac cath eventually to define coronaries. The plan will be to do this if and when she stabilizes and mental status normalizes.  2. Nausea/vomiting/abdominal pain:   3. Pulmonary: LLL PNA, ?aspiration. Covering with abx.   4. Cardiomyopathy:, EF 20%  with diffuse hypokinesis, per bedside reviewed by Dr. Shirlee LatchMcLean. Etiology uncertain => ETOH cardiomyopathy versus stunning from arrest versus ischemic CMP. Cath when more stable. ASA for now. Other medications cannot be added at this time as the blood pressure is low.  Hypotension    The patient continues to require levophed. CVP is low. Input was greater than output yesterday. The patient is not receiving a diuretic at this time. Plan as of January 23, 2016 a.m. is to continue current fluids, allowing input to be greater than output.   Willa RoughJeffrey Monserrat Vidaurri 01/23/2016 7:25 AM

## 2016-01-23 NOTE — Progress Notes (Addendum)
PULMONARY / CRITICAL CARE MEDICINE   Name: Alyssa Garcia MRN: [id number] DOB: Nov 27, 1960    ADMISSION DATE:  06-Feb-2016 CONSULTATION DATE:  2016-02-06  REFERRING MD:  [facility] EDP  CHIEF COMPLAINT:  CARDIAC ARREST  HISTORY OF PRESENT ILLNESS:   Patient was discharged on 3/1 from hospital after being treated for alcoholic pancreatitis. At presentation nurse report documented shows she began having abdominal pain on Friday evening with nausea and vomiting. The emesis had progressed. During IV push of Dilaudid patient began having seizure-like activity. Emergency department physician was contacted and convulsions lasted approximately 1 minute. This progressed to a stair and slow rhythm. Respirations became agonal and patient became pulseless. CPR was initiated. Per documentation patient initially was in coarse V. fib and shock was administered. Patient was subsequently found to be in pulseless electrical activity. Epinephrine was administered twice as well as 150 mg of amiodarone. Family at bedside reports that the patient had only been complaining of abdominal pain with nausea and vomiting. She had not reported any difficulty breathing. The only additional thing of note the patient had missed some of her blood pressure medications.  SUBJECTIVE: No events overnight.  Start rewarm at 5:15 PM today.  VITAL SIGNS: BP 119/53 mmHg  Pulse 108  Temp(Src) 97.5 F (36.4 C) (Core (Comment))  Resp 8  Ht  (1.626 m)  Wt 88.98 kg (196 lb 2.6 oz)  BMI 33.66 kg/m2  SpO2 100%  HEMODYNAMICS: CVP:  [2 mmHg-3 mmHg] 2 mmHg  VENTILATOR SETTINGS: Vent Mode:  [-] PRVC FiO2 (%):  [40 %] 40 % Set Rate:  [24 bmp] 24 bmp Vt Set:  [450 mL] 450 mL PEEP:  [5 cmH20] 5 cmH20 Plateau Pressure:  [13 cmH20-18 cmH20] 13 cmH20  INTAKE / OUTPUT: I/O last 3 completed shifts: In: 4340.4 [I.V.:2500.4; NG/GT:100; IV Piggyback:1740] Out: 2720 [Urine:2320; Emesis/NG output:400]  PHYSICAL EXAMINATION: General:   No acute distress. Off sedation, unresponsive. Integument:  Warm & dry. No rash on exposed skin.  Lymphatics:  No appreciated cervical or supraclavicular lymphadenoapthy. HEENT:  No scleral injection or icterus.  Endotracheal tube in place. PERRL. Cardiovascular:  Regular rate. No edema. No appreciable JVD.  Pulmonary:  Good aeration & clear to auscultation bilaterally. Symmetric chest wall rise on ventilator. Abdomen: Soft. Normal bowel sounds. Protuberant. Musculoskeletal:  Normal bulk. Hand grip strength 5/5 bilaterally. No joint deformity or effusion appreciated. Neurological:  Gag and respiratory drive intact, pupils are sluggish but reactive, otherwise neurologic exam is completely flat. Psychiatric:  Unable to assess given altered mental status.  LABS:  BMET  Recent Labs Lab 01/22/16 1940 01/23/16 0042 01/23/16 0350  NA 140 140 139  K 3.1* 3.4* 3.5  CL 113* 112* 114*  CO2 15* 15* 15*  BUN CREATININE 0.57 0.67 0.78  GLUCOSE 118* 153* 145*   Electrolytes  Recent Labs Lab 01/22/16 0305  01/22/16 1940 01/23/16 0042 01/23/16 0350  CALCIUM 8.7*  < > 8.8* 8.9 8.6*  MG 1.7  --  2.2  --  1.9  PHOS 1.7*  --  4.0  --  4.7*  < > = values in this interval not displayed.  CBC  Recent Labs Lab 02-06-16 1317 01/22/16 0305 01/22/16 0747 01/23/16 0350  WBC 12.3* 11.2*  --  11.2*  HGB 12.8 13.6 14.6 12.4  HCT 41.1 41.7 43.0 38.9  PLT 195 113*  --  142*    Coag's  Recent Labs Lab 06-Feb-2016 0825 02-06-16 1317 February 06, 2016 2120  APTT 27 28 29   INR 1.27 1.22 1.27    Sepsis Markers  Recent Labs Lab 01/28/2016 0911  01/07/2016 2030 01/25/2016 2350 01/22/16 0305 01/23/16 0350  LATICACIDVEN  --   < > 1.0 0.9 1.5  --   PROCALCITON <0.10  --   --   --  0.57 0.74  < > = values in this interval not displayed.  ABG  Recent Labs Lab 01/22/16 1010 01/22/16 1737 01/23/16 0350  PHART 7.474* 7.471* 7.408  PCO2ART 19.9* 21.9* 24.7*  PO2ART 198* 173.0* 147*     Liver Enzymes  Recent Labs Lab 01/31/2016 0658  AST 39  ALT 33  ALKPHOS 207*  BILITOT 1.8*  ALBUMIN 4.4    Cardiac Enzymes  Recent Labs Lab 01/10/2016 1746 01/08/2016 2035 01/25/2016 2320  TROPONINI 0.23* 0.13* 0.10*    Glucose  Recent Labs Lab 01/22/16 0700 01/22/16 1125 01/22/16 1602 01/22/16 1929 01/23/16 0021 01/23/16 0350  GLUCAP 170* 166* 142* 111* 144* 136*    Imaging Dg Chest Port 1 View  01/23/2016  CLINICAL DATA:  Shortness of breath. EXAM: PORTABLE CHEST 1 VIEW COMPARISON:  01/03/2016 . FINDINGS: Endotracheal tube, NG tube, left IJ line stable position. Heart size stable. Bibasilar pulmonary infiltrates, slightly improved from prior exam noted. Small left pleural effusion. No pneumothorax. IMPRESSION: 1. Lines and tubes in stable position. 2. Slight improvement of bibasilar pulmonary infiltrates. Small left pleural effusion again noted. Electronically Signed   By: Maisie Fushomas  Register   On: 01/23/2016 07:17     STUDIES:  Port CXR 3/19:  Personally reviewed by me. ETT too high. No focal opacity or effusion. EKG 3/19:  Sinus tachycardia. QTc 669ms. LAE. ST segment depression in lateral leads.  CULTURES: MRSA PCR 3/19>>>  ANTIBIOTICS: None.  SIGNIFICANT EVENTS: 3/01 - Discharge after hospitalization for alcoholic pancreatitis & hypertensive urgency  LINES/TUBES: OETT 7.0 3/19>>> OGT 3/19>>> FOLEY 3/19>>> PIV x2  ASSESSMENT / PLAN:  PULMONARY A: Acute Respiratory Failure - Post arrest.  P:   Full Vent Support Holding on SBT until/if mental status improves CTA chest neg for PE, positive for pulmonary infiltrate. Zosyn.  CARDIOVASCULAR A:  Cardiac Arrest H/O HTN  P:  Telemetry monitoring Vitals per unit Protocol ASA Cardiology consult appreciated TTE per cards Holding outpatient Lisinopril & Lopressor  RENAL A:   Hypokalemia.  P:   Monitor renal function w/ Foley. Trending BUN/Creatinine & Electrolytes daily. Replace  electrolytes as indicated.  GASTROINTESTINAL A:   H/O Pancreatitis - Secondary to EtOH. Lipase normal.  P:   Protonix IV daily. TF per nutrition.  HEMATOLOGIC A:   No acute issues.  P:  Trend cell counts daily w/ CBC SCDs Heparin Pittman q8hr  INFECTIOUS A:   No acute issue.  P:   Monitor for signs of infection/leukocytosis  ENDOCRINE A:   H/O DM Type II - A1c  6.7 on 3/2.  P:   Accu-Checks q4hr while NPO, will start TF. SSI per low dose algorithm Holding outpatient Metformin  NEUROLOGIC A:   Possible Seizure - During IV Dilaudid push in ED. Sedation on Ventilator H/O Depression  P:   RASS goal: 0 to -1 (when not on paralytics) D/C versed/fentanly/nimbex. EEG Flat. CT Head w/o noted Holding outpatient Prozac & Wellbutrin Neurology consult called.  FAMILY  - Updates:  Family updated bedside, after discussion patient was made a LCB with no CPR/cardioversion, neurology to see patient.  - Inter-disciplinary family meet or Palliative Care meeting due by:  3/26  The patient  is critically ill with multiple organ systems failure and requires high complexity decision making for assessment and support, frequent evaluation and titration of therapies, application of advanced monitoring technologies and extensive interpretation of multiple databases.   Critical Care Time devoted to patient care services described in this note is  35  Minutes. This time reflects time of care of this signee Dr Jennet Maduro. This critical care time does not reflect procedure time, or teaching time or supervisory time of PA/NP/Med student/Med Resident etc but could involve care discussion time.  Rush Farmer, M.D. [facility] Pulmonary/Critical Care Medicine. Pager: [phone]. After hours pager: [phone].

## 2016-01-24 ENCOUNTER — Inpatient Hospital Stay (HOSPITAL_COMMUNITY): Payer: BLUE CROSS/BLUE SHIELD

## 2016-01-24 DIAGNOSIS — R570 Cardiogenic shock: Secondary | ICD-10-CM

## 2016-01-24 DIAGNOSIS — G931 Anoxic brain damage, not elsewhere classified: Secondary | ICD-10-CM

## 2016-01-24 LAB — BLOOD GAS, ARTERIAL
ACID-BASE DEFICIT: 8.6 mmol/L — AB (ref 0.0–2.0)
ACID-BASE DEFICIT: 9.1 mmol/L — AB (ref 0.0–2.0)
Bicarbonate: 14.4 mEq/L — ABNORMAL LOW (ref 20.0–24.0)
Bicarbonate: 15.9 mEq/L — ABNORMAL LOW (ref 20.0–24.0)
DRAWN BY: 39899
Drawn by: 244861
FIO2: 0.4
FIO2: 0.4
MECHVT: 450 mL
MECHVT: 450 mL
O2 SAT: 99.7 %
O2 SAT: 99.4 %
PATIENT TEMPERATURE: 98.6
PEEP/CPAP: 5 cmH2O
PEEP/CPAP: 5 cmH2O
PH ART: 7.474 — AB (ref 7.350–7.450)
PH ART: 7.314 — AB (ref 7.350–7.450)
PO2 ART: 187 mmHg — AB (ref 80.0–100.0)
Patient temperature: 98.6
RATE: 28 resp/min
RATE: 25 resp/min
TCO2: 15 mmol/L (ref 0–100)
TCO2: 16.9 mmol/L (ref 0–100)
pCO2 arterial: 19.9 mmHg — CL (ref 35.0–45.0)
pCO2 arterial: 32.2 mmHg — ABNORMAL LOW (ref 35.0–45.0)
pO2, Arterial: 198 mmHg — ABNORMAL HIGH (ref 80.0–100.0)

## 2016-01-24 LAB — GLUCOSE, CAPILLARY
GLUCOSE-CAPILLARY: 119 mg/dL — AB (ref 65–99)
GLUCOSE-CAPILLARY: 141 mg/dL — AB (ref 65–99)
GLUCOSE-CAPILLARY: 160 mg/dL — AB (ref 65–99)
GLUCOSE-CAPILLARY: 183 mg/dL — AB (ref 65–99)
Glucose-Capillary: 125 mg/dL — ABNORMAL HIGH (ref 65–99)
Glucose-Capillary: 175 mg/dL — ABNORMAL HIGH (ref 65–99)
Glucose-Capillary: 135 mg/dL — ABNORMAL HIGH (ref 65–99)

## 2016-01-24 LAB — BASIC METABOLIC PANEL
Anion gap: 7 (ref 5–15)
BUN: 9 mg/dL (ref 6–20)
CHLORIDE: 113 mmol/L — AB (ref 101–111)
CO2: 16 mmol/L — AB (ref 22–32)
CREATININE: 0.82 mg/dL (ref 0.44–1.00)
Calcium: 9 mg/dL (ref 8.9–10.3)
GFR calc Af Amer: >60 mL/min (ref >60)
GFR calc non Af Amer: >60 mL/min (ref >60)
GLUCOSE: 155 mg/dL — AB (ref 65–99)
Potassium: 4.7 mmol/L (ref 3.5–5.1)
Sodium: 136 mmol/L (ref 135–145)

## 2016-01-24 LAB — CBC
HEMATOCRIT: 36.8 % (ref 36.0–46.0)
HEMOGLOBIN: 11.4 g/dL — AB (ref 12.0–15.0)
MCH: 27 pg (ref 26.0–34.0)
MCHC: 31 g/dL (ref 30.0–36.0)
MCV: 87 fL (ref 78.0–100.0)
Platelets: 123 10*3/uL — ABNORMAL LOW (ref 150–400)
RBC: 4.23 MIL/uL (ref 3.87–5.11)
RDW: 15.6 % — ABNORMAL HIGH (ref 11.5–15.5)
WBC: 9 10*3/uL (ref 4.0–10.5)

## 2016-01-24 LAB — MAGNESIUM: Magnesium: 1.7 mg/dL (ref 1.7–2.4)

## 2016-01-24 LAB — PHOSPHORUS: Phosphorus: 4 mg/dL (ref 2.5–4.6)

## 2016-01-24 MED ORDER — ACETAMINOPHEN 160 MG/5ML PO SOLN
650.0000 mg | Freq: Four times a day (QID) | ORAL | Status: DC | PRN
  Administered 2016-01-24: 650 mg via ORAL
  Filled 2016-01-24: qty 20.3

## 2016-01-24 MED ORDER — PROPOFOL BOLUS VIA INFUSION
20.0000 mg | INTRAVENOUS | Status: DC | PRN
  Filled 2016-01-24: qty 20

## 2016-01-24 MED ORDER — SODIUM CHLORIDE 0.9 % IV SOLN: INTRAVENOUS | Status: DC

## 2016-01-24 MED ORDER — METOPROLOL TARTRATE 1 MG/ML IV SOLN
5.0000 mg | Freq: Four times a day (QID) | INTRAVENOUS | Status: DC
  Administered 2016-01-24 – 2016-01-25 (×4): 5 mg via INTRAVENOUS
  Filled 2016-01-24 (×4): qty 5

## 2016-01-24 MED ORDER — PANTOPRAZOLE SODIUM 40 MG PO PACK
40.0000 mg | PACK | Freq: Every day | ORAL | Status: DC
  Administered 2016-01-24 – 2016-01-25 (×2): 40 mg
  Filled 2016-01-24 (×2): qty 20

## 2016-01-24 MED ORDER — ROCURONIUM BROMIDE 50 MG/5ML IV SOLN
0.6000 mg/kg | Freq: Once | INTRAVENOUS | Status: AC
  Administered 2016-01-24: 50 mg via INTRAVENOUS

## 2016-01-24 MED ORDER — ROCURONIUM BROMIDE 50 MG/5ML IV SOLN: 1.0000 mg/kg | Freq: Once | INTRAVENOUS | Status: DC

## 2016-01-24 MED ORDER — PROPOFOL 1000 MG/100ML IV EMUL
INTRAVENOUS | Status: AC
  Filled 2016-01-24: qty 100

## 2016-01-24 MED ORDER — PROPOFOL 1000 MG/100ML IV EMUL
5.0000 ug/kg/min | INTRAVENOUS | Status: DC
  Administered 2016-01-24: 10 ug/kg/min via INTRAVENOUS
  Administered 2016-01-24: 35 ug/kg/min via INTRAVENOUS
  Administered 2016-01-25: 45 ug/kg/min via INTRAVENOUS
  Filled 2016-01-24 (×3): qty 100

## 2016-01-24 NOTE — Progress Notes (Signed)
eLink Physician-Brief Progress Note Patient Name: Alyssa GowdaLeslie A Garcia DOB: 05/23/1961 MRN: [id number]   Date of Service  01/24/2016  HPI/Events of Note  Camera check on patient after she returned from MRI. Patient spontaneously raising right arm off of bed. Spontaneously opening her eyes. Hypertensive on telemetry monitor with tachycardia. Grimacing.  Biting at the tube.  eICU Interventions  1. Start Propofol infusion for goal RASS 0 to -1 2. Bite Block for ETT     Intervention Category Major Interventions: Change in mental status - evaluation and management  [name] 01/24/2016, 4:32 PM

## 2016-01-24 NOTE — Progress Notes (Signed)
eLink Physician-Brief Progress Note Patient Name: Alyssa GowdaLeslie A Greener DOB: 05/30/1961 MRN: [id number]   Date of Service  01/24/2016  HPI/Events of Note  Bedside nurse contacted regarding  Patient developing a fever and requiring further cooling from pads to maintain normothermia.  eICU Interventions  1. Tylenol when necessary fever 2. Blood, urine, & tracheal aspirate cultures now     Intervention Category Intermediate Interventions: Other:  Lawanda CousinsJennings Rilley Poulter 01/24/2016, 9:04 PM

## 2016-01-24 NOTE — Progress Notes (Signed)
PULMONARY / CRITICAL CARE MEDICINE   Name: Alyssa Garcia MRN: [id number] DOB: 02/28/1961    ADMISSION DATE:  01/14/2016 CONSULTATION DATE:  01/23/2016  REFERRING MD:  [facility]  CHIEF COMPLAINT:  CARDIAC ARREST  HISTORY OF PRESENT ILLNESS:   Patient was discharged on 3/1 from hospital after being treated for alcoholic pancreatitis. At presentation nurse report documented shows she began having abdominal pain on Friday evening with nausea and vomiting. The emesis had progressed. During IV push of Dilaudid patient began having seizure-like activity. Emergency department physician was contacted and convulsions lasted approximately 1 minute. This progressed to a stair and slow rhythm. Respirations became agonal and patient became pulseless. CPR was initiated. Per documentation patient initially was in coarse V. fib and shock was administered. Patient was subsequently found to be in pulseless electrical activity. Epinephrine was administered twice as well as 150 mg of amiodarone. Family at bedside reports that the patient had only been complaining of abdominal pain with nausea and vomiting. She had not reported any difficulty breathing. The only additional thing of note the patient had missed some of her blood pressure medications.  SUBJECTIVE: No events overnight.  Start rewarm at 5:15 PM today.  VITAL SIGNS: BP 160/71 mmHg  Pulse 117  Temp(Src) 98.8 F (37.1 C) (Core (Comment))  Resp 27  Ht 5\' 4"  (1.626 m)  Wt 93.5 kg (206 lb 2.1 oz)  BMI 35.36 kg/m2  SpO2 100%  HEMODYNAMICS: CVP:  [3 mmHg-7 mmHg] 3 mmHg  VENTILATOR SETTINGS: Vent Mode:  [-] PRVC FiO2 (%):  [30 %-40 %] 30 % Set Rate:  [24 bmp] 24 bmp Vt Set:  [450 mL] 450 mL PEEP:  [5 cmH20] 5 cmH20 Pressure Support:  [5 cmH20] 5 cmH20 Plateau Pressure:  [9 cmH20-19 cmH20] 9 cmH20  INTAKE / OUTPUT: I/O last 3 completed shifts: In: 2441.7 [I.V.:1506; NG/GT:365.7; IV Piggyback:570] Out: 1480 [Urine:1280; Emesis/NG  output:200]  PHYSICAL EXAMINATION: General:  No acute distress. Off sedation, unresponsive. Integument:  Warm & dry. No rash on exposed skin.  Lymphatics:  No appreciated cervical or supraclavicular lymphadenoapthy. HEENT:  No scleral injection or icterus.  Endotracheal tube in place. PERRL. Cardiovascular:  Regular rate. No edema. No appreciable JVD.  Pulmonary:  Good aeration & clear to auscultation bilaterally. Symmetric chest wall rise on ventilator. Abdomen: Soft. Normal bowel sounds. Protuberant. Musculoskeletal:  Normal bulk. Hand grip strength 5/5 bilaterally. No joint deformity or effusion appreciated. Neurological:  Gag and respiratory drive intact, pupils are sluggish but reactive, otherwise neurologic exam is completely flat. Psychiatric:  Unable to assess given altered mental status.  LABS:  BMET  Recent Labs Lab 01/23/16 0042 01/23/16 0350 01/24/16 0426  NA 140 139 136  K 3.4* 3.5 4.7  CL 112* 114* 113*  CO2 15* 15* 16*  BUN 10 10 9   CREATININE 0.67 0.78 0.82  GLUCOSE 153* 145* 155*   Electrolytes  Recent Labs Lab 01/22/16 1940 01/23/16 0042 01/23/16 0350 01/24/16 0426  CALCIUM 8.8* 8.9 8.6* 9.0  MG 2.2  --  1.9 1.7  PHOS 4.0  --  4.7* 4.0   CBC  Recent Labs Lab 01/22/16 0305 01/22/16 0747 01/23/16 0350 01/24/16 0426  WBC 11.2*  --  11.2* 9.0  HGB 13.6 14.6 12.4 11.4*  HCT 41.7 43.0 38.9 36.8  PLT 113*  --  142* 123*   Coag's  Recent Labs Lab 01/11/2016 0825 01/18/2016 1317 02/01/2016 2120  APTT 27 28 29   INR 1.27 1.22 1.27   Sepsis  Markers  Recent Labs Lab 01/19/2016 0911  01/16/2016 2030 01/16/2016 2350 01/22/16 0305 01/23/16 0350  LATICACIDVEN  --   < > 1.0 0.9 1.5  --   PROCALCITON <0.10  --   --   --  0.57 0.74  < > = values in this interval not displayed.  ABG  Recent Labs Lab 01/22/16 1737 01/23/16 0350 01/24/16 0315  PHART 7.471* 7.408 7.314*  PCO2ART 21.9* 24.7* 32.2*  PO2ART 173.0* 147* 187*   Liver  Enzymes  Recent Labs Lab 01/14/2016 0658  AST 39  ALT 33  ALKPHOS 207*  BILITOT 1.8*  ALBUMIN 4.4   Cardiac Enzymes  Recent Labs Lab 01/09/2016 1746 01/14/2016 2035 01/19/2016 2320  TROPONINI 0.23* 0.13* 0.10*   Glucose  Recent Labs Lab 01/23/16 1250 01/23/16 1707 01/23/16 2000 01/23/16 2348 01/24/16 0435 01/24/16 0727  GLUCAP 134* 118* 132* 125* 141* 175*   Imaging Dg Chest Port 1 View  01/24/2016  CLINICAL DATA:  Shortness of breath. EXAM: PORTABLE CHEST 1 VIEW COMPARISON:  01/23/2016. FINDINGS: Endotracheal tube, NG tube, left IJ line stable position. Cardiomegaly with normal pulmonary vascularity. Low lung volumes with basilar atelectasis. Continued improvement of basilar infiltrates. No pleural effusion or pneumothorax. IMPRESSION: 1. Lines and tubes in stable position. 2. Continued improvement of bibasilar infiltrates. Low lung volumes with mild bibasilar atelectasis. Electronically Signed   By: Maisie Fus  Register   On: 01/24/2016 07:12   STUDIES:  Port CXR 3/19:  Personally reviewed by me. ETT too high. No focal opacity or effusion. EKG 3/19:  Sinus tachycardia. QTc . LAE. ST segment depression in lateral leads.  CULTURES: MRSA PCR 3/19>>>  ANTIBIOTICS: Zoxyn 3/19>>>.  SIGNIFICANT EVENTS: 3/01 - Discharge after hospitalization for alcoholic pancreatitis & hypertensive urgency  LINES/TUBES: OETT 7.0 3/19>>> OGT 3/19>>> FOLEY 3/19>>> PIV x2>>>  ASSESSMENT / PLAN:  PULMONARY A: Acute Respiratory Failure - Post arrest.  P:   Full Vent Support Holding on SBT until/if mental status improves CTA chest neg for PE, positive for pulmonary infiltrate. Zosyn.  CARDIOVASCULAR A:  Cardiac Arrest H/O HTN  P:  Telemetry monitoring Vitals per unit Protocol ASA Cardiology consult appreciated TTE per cards Holding outpatient Lisinopril & Lopressor  RENAL A:   Hypokalemia.  P:   Monitor renal function w/ Foley. Trending BUN/Creatinine & Electrolytes  daily. Replace electrolytes as indicated.  GASTROINTESTINAL A:   H/O Pancreatitis - Secondary to EtOH. Lipase normal.  P:   Protonix IV daily. TF per nutrition.  HEMATOLOGIC A:   No acute issues.  P:  Trend cell counts daily w/ CBC SCDs Heparin Kite q8hr  INFECTIOUS A:   No acute issue.  P:   Monitor for signs of infection/leukocytosis  ENDOCRINE A:   H/O DM Type II - A1c  6.7 on 3/2.  P:   Accu-Checks q4hr while NPO, will start TF. SSI per low dose algorithm Holding outpatient Metformin  NEUROLOGIC A:   Possible Seizure - During IV Dilaudid push in ED. Sedation on Ventilator H/O Depression  P:   RASS goal: 0 to -1 (when not on paralytics) D/C versed/fentanly/nimbex. EEG Flat. CT Head w/o noted Holding outpatient Prozac & Wellbutrin Neurology consult appreciated. MRI of the brain today.  FAMILY  - Updates:  Family updated bedside, will order MRI.  - Inter-disciplinary family meet or Palliative Care meeting due by:  3/26  The patient is critically ill with multiple organ systems failure and requires high complexity decision making for assessment and support, frequent evaluation and titration  of therapies, application of advanced monitoring technologies and extensive interpretation of multiple databases.   Critical Care Time devoted to patient care services described in this note is  35  Minutes. This time reflects time of care of this signee Dr Jennet Maduro. This critical care time does not reflect procedure time, or teaching time or supervisory time of PA/NP/Med student/Med Resident etc but could involve care discussion time.  Rush Farmer, M.D. [facility] Pulmonary/Critical Care Medicine. Pager: [phone]. After hours pager: [phone].

## 2016-01-24 NOTE — Progress Notes (Signed)
Patient Name:  Alyssa Garcia, DOB: Oct 24, 1961, MRN: [id number] Primary Doctor: Pcp Not In System Primary Cardiologist:   Date: 01/24/2016  SUBJECTIVE:  The patient is intubated. She continues to be unresponsive.   Past Medical History  Diagnosis Date  . Depression   . Hyperlipidemia   . Diabetes mellitus without complication (HCC)   . Pancreatitis     Secondary to EtOH   Filed Vitals:   01/24/16 0700 01/24/16 0742 01/24/16 0800 01/24/16 0823  BP: 134/73 154/70 130/71 160/71  Pulse: 115 118 115 117  Temp: 98.6 F (37 C)  98.8 F (37.1 C)   TempSrc: Core (Comment)  Core (Comment)   Resp: Height:      Weight:      SpO2: 100% 100% 100% 100%    Intake/Output Summary (Last 24 hours) at 01/24/16 0852 Last data filed at 01/24/16 0800  Gross per 24 hour  Intake 1503.43 ml  Output   1090 ml  Net 413.43 ml   Filed Weights   01/22/16 0500 01/23/16 0425 01/24/16 0418  Weight: 195 lb 11.6 oz (88.78 kg) 196 lb 2.6 oz (88.98 kg) 206 lb 2.1 oz (93.5 kg)     LABS: Basic Metabolic Panel:  Recent Labs  40/98/11 0350 01/24/16 0426  NA 139 136  K 3.5 4.7  CL 114* 113*  CO2 15* 16*  GLUCOSE 145* 155*  BUN 10 9  CREATININE 0.78 0.82  CALCIUM 8.6* 9.0  MG 1.9 1.7  PHOS 4.7* 4.0   Liver Function Tests: No results for input(s): AST, ALT, ALKPHOS, BILITOT, PROT, ALBUMIN in the last 72 hours.  Recent Labs  01/03/2016 0911  AMYLASE 61   CBC:  Recent Labs  01/19/2016 1317  01/23/16 0350 01/24/16 0426  WBC 12.3*  < > 11.2* 9.0  NEUTROABS 10.0*  --   --   --   HGB 12.8  < > 12.4 11.4*  HCT 41.1  < > 38.9 36.8  MCV 85.6  < > 82.6 87.0  PLT 195  < > 142* 123*  < > = values in this interval not displayed. Cardiac Enzymes:  Recent Labs  02/01/2016 1746 01/19/2016 2035 01/04/2016 2320  TROPONINI 0.23* 0.13* 0.10*   BNP: Invalid input(s): POCBNP D-Dimer: No results for input(s): DDIMER in the last 72 hours. Thyroid Function Tests: No results for  input(s): TSH, T4TOTAL, T3FREE, THYROIDAB in the last 72 hours.  Invalid input(s): FREET3  RADIOLOGY: Ct Head Wo Contrast  01/23/2016  CLINICAL DATA:  Altered mental status with seizure. Recent cardiopulmonary arrest EXAM: CT HEAD WITHOUT CONTRAST TECHNIQUE: Contiguous axial images were obtained from the base of the skull through the vertex without intravenous contrast. COMPARISON:  None. FINDINGS: The ventricles are normal in size and configuration. There is no intracranial mass hemorrhage, extra-axial fluid collection, or midline shift. The gray-white compartments are normal. There is no acute infarct evident. Gray-white differentiation appears normal. Bony calvarium appears intact. The mastoid air cells are clear. No intraorbital lesions are identified. There is mucosal thickening in the maxillary antra bilaterally as well as in several ethmoid air cells bilaterally. IMPRESSION: Mild paranasal sinus disease. No intracranial mass, hemorrhage, or edema. No acute infarct evident. Electronically Signed   By: Bretta Bang III M.D.   On: 01/29/2016 10:15   Ct Angio Chest Pe W/cm &/or Wo Cm  01/15/2016  CLINICAL DATA:  Shortness of breath/respiratory failure EXAM: CT ANGIOGRAPHY CHEST WITH CONTRAST TECHNIQUE: Multidetector CT  imaging of the chest was performed using the standard protocol during bolus administration of intravenous contrast. Multiplanar CT image reconstructions and MIPs were obtained to evaluate the vascular anatomy. CONTRAST:  100mL OMNIPAQUE IOHEXOL 350 MG/ML SOLN COMPARISON:  Chest radiograph January 21, 2016 FINDINGS: Mediastinum/Lymph Nodes: There is no demonstrable pulmonary embolus. There is no thoracic aortic aneurysm or dissection. The visualized great vessels appear unremarkable. There are foci of coronary artery calcification. The pericardium is not thickened. Visualized thyroid appears normal. There is no appreciable thoracic adenopathy. Lungs/Pleura: There is patchy atelectasis in  both lower lobes with lesser atelectasis noted throughout the lungs bilaterally. There is focal airspace consolidation in superior and medial segments of the left lower lobe concerning for pneumonia in these areas. Endotracheal tube tip is above the carina. No pneumothorax. Upper abdomen: Nasogastric tube extends into the stomach and loops back on itself with the tip in the distal esophagus. Visualized upper abdominal structures otherwise appear unremarkable. Musculoskeletal: There are foci of degenerative change in the thoracic spine. There are no blastic or lytic bone lesions. Review of the MIP images confirms the above findings. IMPRESSION: No demonstrable pulmonary embolus. Focal consolidation concerning for pneumonia in portions of the superior and medial segments of the left lower lobe. There is also bibasilar atelectasis. Nasogastric tube extends into the stomach where it loops back upon itself with the tip in the distal esophagus. No demonstrable adenopathy. These results will be called to the ordering clinician or representative by the Radiologist Assistant, and communication documented in the PACS or zVision Dashboard. Electronically Signed   By: Bretta BangWilliam  Woodruff III M.D.   On: 01/31/2016 10:29   Ct Abdomen Pelvis W Contrast  12/31/2015  CLINICAL DATA:  Mid abdominal pain with nausea vomiting and diarrhea. Elevated serum lipase level EXAM: CT ABDOMEN AND PELVIS WITH CONTRAST TECHNIQUE: Multidetector CT imaging of the abdomen and pelvis was performed using the standard protocol following bolus administration of intravenous contrast. CONTRAST:  100mL OMNIPAQUE IOHEXOL 300 MG/ML SOLN, 25mL OMNIPAQUE IOHEXOL 300 MG/ML SOLN COMPARISON:  12/31/2015 ; report dated 02/06/2000 FINDINGS: Lower chest:  Mild cardiomegaly. Hepatobiliary: Mild diffuse hepatic steatosis. Pancreas: Diffuse peripancreatic stranding compatible with acute pancreatitis. No findings of necrosis, abscess, or pseudocyst. Spleen: Unremarkable  Adrenals/Urinary Tract: Unremarkable Stomach/Bowel: Unremarkable Vascular/Lymphatic: Upper peripancreatic lymph node 1.5 cm in short axis on image 22 series 2. Portacaval node 1.3 cm in short axis, image 24 series 2. Porta hepatis node 1.3 cm in short axis, image 27 series 2. These lymph nodes are likely reactive. Reproductive: Unremarkable Other: Small amount of perihepatic and perisplenic ascites. Trace fluid in the paracolic gutters. Musculoskeletal: Lower lumbar spondylosis and degenerative disc disease. A transitional lumbosacral vertebra is assumed to represent the S1 level. Careful correlation with this numbering strategy prior to any procedural intervention would be recommended. There is potentially prominent central narrowing of the thecal sac at the L4-5 level especially eccentric to the left due to disc bulge and disc protrusion along with at least mild left foraminal stenosis at this level. There is also intervertebral spurring at the L5-S1 level. Infraumbilical hernia contains adipose tissue. IMPRESSION: 1. Acute pancreatitis. No pancreatic abscess, pseudocyst, or necrosis. Peripancreatic stranding observed with a small amount of perihepatic and perisplenic ascites. Surrounding porta hepatis reactive lymph nodes. 2. Lumbar spondylosis and degenerative disc disease potentially with considerable impingement at the L4-5 level (please note that S1 is transitional). 3. Infraumbilical hernia contains adipose tissue. 4. Mild cardiomegaly. 5. Mild diffuse hepatic steatosis. Electronically Signed  By: Gaylyn Rong M.D.   On: 12/31/2015 17:04   Dg Chest Port 1 View  01/24/2016  CLINICAL DATA:  Shortness of breath. EXAM: PORTABLE CHEST 1 VIEW COMPARISON:  01/23/2016. FINDINGS: Endotracheal tube, NG tube, left IJ line stable position. Cardiomegaly with normal pulmonary vascularity. Low lung volumes with basilar atelectasis. Continued improvement of basilar infiltrates. No pleural effusion or pneumothorax.  IMPRESSION: 1. Lines and tubes in stable position. 2. Continued improvement of bibasilar infiltrates. Low lung volumes with mild bibasilar atelectasis. Electronically Signed   By: Maisie Fus  Register   On: 01/24/2016 07:12   Dg Chest Port 1 View  01/23/2016  CLINICAL DATA:  Shortness of breath. EXAM: PORTABLE CHEST 1 VIEW COMPARISON:  01/09/2016 . FINDINGS: Endotracheal tube, NG tube, left IJ line stable position. Heart size stable. Bibasilar pulmonary infiltrates, slightly improved from prior exam noted. Small left pleural effusion. No pneumothorax. IMPRESSION: 1. Lines and tubes in stable position. 2. Slight improvement of bibasilar pulmonary infiltrates. Small left pleural effusion again noted. Electronically Signed   By: Maisie Fus  Register   On: 01/23/2016 07:17   Dg Chest Port 1 View  01/16/2016  CLINICAL DATA:  Central line placement and shortness of breath EXAM: PORTABLE CHEST 1 VIEW COMPARISON:  01/28/2016 FINDINGS: Endotracheal tube tip is 2.7 cm above the carina. There is a left internal jugular central line with tip over the superior vena cava. Mild cardiac enlargement persists. There is now bilateral perihilar and lower lobe opacity. Possible small left pleural effusion. IMPRESSION: Bilateral perihilar infiltrates concerning for pulmonary edema. Bilateral pneumonia not excluded. Lines and tubes as described above. Electronically Signed   By: Esperanza Heir M.D.   On: 01/29/2016 18:11   Dg Chest Portable 1 View  01/25/2016  CLINICAL DATA:  Hypoxia EXAM: PORTABLE CHEST 1 VIEW COMPARISON:  December 31, 2015 FINDINGS: Endotracheal tube tip is 5.2 cm above the carina. No pneumothorax. No edema or consolidation. Heart is upper normal in size with pulmonary vascularity within normal limits. No adenopathy. No bone lesions. IMPRESSION: Endotracheal tube as described without pneumothorax. No edema or consolidation. Stable cardiac silhouette. Electronically Signed   By: Bretta Bang III M.D.   On:  01/15/2016 08:39   Dg Abd Acute W/chest  12/31/2015  CLINICAL DATA:  Patient with diffuse abdominal pain, nausea and vomiting. EXAM: DG ABDOMEN ACUTE W/ 1V CHEST COMPARISON:  None. FINDINGS: Normal cardiac and mediastinal contours. No consolidative pulmonary opacities. No pleural effusion or pneumothorax. Gas is demonstrated within nondilated loops of large and small bowel in a nonobstructed pattern. No free intraperitoneal air. Unremarkable osseous skeleton. IMPRESSION: No acute cardiopulmonary process. Nonobstructed bowel gas pattern. Electronically Signed   By: Annia Belt M.D.   On: 12/31/2015 12:05    PHYSICAL EXAM  patient is intubated. She is not responsive. There is 1+ peripheral edema. Cardiac exam reveals S1 and S2 and a systolic murmur. Lungs reveal scattered rhonchi.   TELEMETRY: I have reviewed telemetry today January 24, 2016. There is sinus tachycardia   ASSESSMENT AND PLAN:   55 yo with history of ETOH abuse, ETOH pancreatitis, type II diabetes is seen after cardiac arrest. She was just discharged on 3/1 from hospitalization for ETOH pancreatitis.     Cardiac arrest: Occurred in the setting of nausea/vomiting and an apparent seizure after getting Dilaudid. Initial rhythm was slow PEA but had vfib at one point. It is possible that the initial PEA arrest was triggered by aspiration in the setting of vomiting and seizing. Continued hypothermic  protocol with cooling now turned off. Slight troponin elevation trending down. Patient was not taken to the cath lab acutely. Suspicion for aspiration as trigger for PEA event and do not know what the GI process is that was causing the nausea/vomiting and abdominal pain.   - Echo showed EF 20% with diffuse hypokinesis. Uncertain etiology => could be due to heavy ETOH history or stunning from arrest. Also could be ischemic cardiomyopathy. She will need cardiac cath eventually to define coronaries  if her mental status recovers.      Cardiomyopathy:, EF 20% with diffuse hypokinesis, per original bedside reviewed by Dr. Shirlee Latch. Etiology uncertain => ETOH cardiomyopathy versus stunning from arrest versus ischemic CMP. Cath when more stable. ASA for now. Now that  blood pressure is more stable, beta blocker will be added.  Hypotension  Levophed was weaned off yesterday. Blood pressure is now more stable.   Sinus tachycardia    Patient now has sinus tachycardia. She is oxygenating well. Her intravascular volume is probably in a good range at this time. The patient responded well yesterday to 1 dose of 5 mg of IV Lopressor. I will order this for regular dosing. It will have to be IV at this point. When she can take by mouth, I will change it to carvedilol.    Altered mental status     Neurology is assessing. She is not responsive at this time.    Respiratory failure (HCC)     The patient is still intubated.   Willa Rough 01/24/2016 8:52 AM

## 2016-01-25 ENCOUNTER — Inpatient Hospital Stay (HOSPITAL_COMMUNITY): Payer: BLUE CROSS/BLUE SHIELD

## 2016-01-25 LAB — GLUCOSE, CAPILLARY
GLUCOSE-CAPILLARY: 193 mg/dL — AB (ref 65–99)
Glucose-Capillary: 153 mg/dL — ABNORMAL HIGH (ref 65–99)
Glucose-Capillary: 182 mg/dL — ABNORMAL HIGH (ref 65–99)
Glucose-Capillary: 207 mg/dL — ABNORMAL HIGH (ref 65–99)
Glucose-Capillary: 234 mg/dL — ABNORMAL HIGH (ref 65–99)

## 2016-01-25 LAB — BLOOD GAS, ARTERIAL
Acid-base deficit: 7.1 mmol/L — ABNORMAL HIGH (ref 0.0–2.0)
BICARBONATE: 17.4 meq/L — AB (ref 20.0–24.0)
Drawn by: 449561
FIO2: 0.6
LHR: 24 {breaths}/min
O2 SAT: 99.7 %
PATIENT TEMPERATURE: 98.6
PCO2 ART: 32.3 mmHg — AB (ref 35.0–45.0)
PEEP/CPAP: 5 cmH2O
PO2 ART: 200 mmHg — AB (ref 80.0–100.0)
TCO2: 18.4 mmol/L (ref 0–100)
VT: 450 mL
pH, Arterial: 7.352 (ref 7.350–7.450)

## 2016-01-25 LAB — BASIC METABOLIC PANEL
Anion gap: 10 (ref 5–15)
BUN: 10 mg/dL (ref 6–20)
CHLORIDE: 109 mmol/L (ref 101–111)
CO2: 20 mmol/L — ABNORMAL LOW (ref 22–32)
Calcium: 9.3 mg/dL (ref 8.9–10.3)
Creatinine, Ser: 0.7 mg/dL (ref 0.44–1.00)
GFR calc Af Amer: >60 mL/min (ref >60)
GFR calc non Af Amer: >60 mL/min (ref >60)
GLUCOSE: 166 mg/dL — AB (ref 65–99)
POTASSIUM: 3.7 mmol/L (ref 3.5–5.1)
Sodium: 139 mmol/L (ref 135–145)

## 2016-01-25 LAB — CBC
HEMATOCRIT: 33.2 % — AB (ref 36.0–46.0)
HEMOGLOBIN: 10.5 g/dL — AB (ref 12.0–15.0)
MCH: 27.3 pg (ref 26.0–34.0)
MCHC: 31.6 g/dL (ref 30.0–36.0)
MCV: 86.5 fL (ref 78.0–100.0)
Platelets: 116 10*3/uL — ABNORMAL LOW (ref 150–400)
RBC: 3.84 MIL/uL — AB (ref 3.87–5.11)
RDW: 15.6 % — ABNORMAL HIGH (ref 11.5–15.5)
WBC: 7.7 10*3/uL (ref 4.0–10.5)

## 2016-01-25 LAB — MAGNESIUM: Magnesium: 1.6 mg/dL — ABNORMAL LOW (ref 1.7–2.4)

## 2016-01-25 LAB — PHOSPHORUS: Phosphorus: 3.3 mg/dL (ref 2.5–4.6)

## 2016-01-25 LAB — TRIGLYCERIDES: Triglycerides: 153 mg/dL — ABNORMAL HIGH (ref <150)

## 2016-01-25 MED ORDER — MAGNESIUM SULFATE 2 GM/50ML IV SOLN
2.0000 g | Freq: Once | INTRAVENOUS | Status: AC
  Administered 2016-01-25: 2 g via INTRAVENOUS
  Filled 2016-01-25: qty 50

## 2016-01-25 MED ORDER — LORAZEPAM 2 MG/ML IJ SOLN
2.0000 mg | INTRAMUSCULAR | Status: DC | PRN
  Administered 2016-01-25: 2 mg via INTRAVENOUS

## 2016-01-25 MED ORDER — METOPROLOL TARTRATE 25 MG/10 ML ORAL SUSPENSION
25.0000 mg | Freq: Two times a day (BID) | ORAL | Status: DC
  Administered 2016-01-25 (×2): 25 mg
  Filled 2016-01-25 (×2): qty 10

## 2016-01-25 MED ORDER — NICARDIPINE HCL IN NACL 20-0.86 MG/200ML-% IV SOLN
3.0000 mg/h | INTRAVENOUS | Status: DC
  Administered 2016-01-25: 10 mg/h via INTRAVENOUS
  Administered 2016-01-25: 5 mg/h via INTRAVENOUS
  Administered 2016-01-25: 10 mg/h via INTRAVENOUS
  Filled 2016-01-25 (×4): qty 200

## 2016-01-25 MED ORDER — AMLODIPINE 1 MG/ML ORAL SUSPENSION
10.0000 mg | Freq: Every day | ORAL | Status: DC
  Administered 2016-01-25: 10 mg
  Filled 2016-01-25 (×3): qty 10

## 2016-01-25 MED ORDER — NICARDIPINE HCL IN NACL 40-0.83 MG/200ML-% IV SOLN
3.0000 mg/h | INTRAVENOUS | Status: DC
  Administered 2016-01-26 (×2): 12.5 mg/h via INTRAVENOUS
  Administered 2016-01-26: 10 mg/h via INTRAVENOUS
  Filled 2016-01-25 (×3): qty 200

## 2016-01-25 MED ORDER — METOPROLOL TARTRATE 1 MG/ML IV SOLN
5.0000 mg | Freq: Four times a day (QID) | INTRAVENOUS | Status: DC
  Administered 2016-01-25: 5 mg via INTRAVENOUS
  Filled 2016-01-25: qty 5

## 2016-01-25 MED ORDER — LORAZEPAM 2 MG/ML IJ SOLN
INTRAMUSCULAR | Status: AC
  Filled 2016-01-25: qty 1

## 2016-01-25 MED ORDER — HYDRALAZINE HCL 20 MG/ML IJ SOLN
20.0000 mg | Freq: Once | INTRAMUSCULAR | Status: AC
  Administered 2016-01-25: 20 mg via INTRAVENOUS

## 2016-01-25 NOTE — Progress Notes (Signed)
eLink Physician-Brief Progress Note Patient Name: Alyssa Garcia DOB: 04/10/1961 MRN: [id number]   Date of Service  01/25/2016  HPI/Events of Note  Contacted by bedside nurse reporting patient significantly agitated and appears uncomfortable. Reviewed rounding notes from neurology & critical care today.  Plan is to keep patient off all pain medications and sedatives for neurological assessment. Patient now with malignant hypertension.  eICU Interventions  1. Starting Cardene infusion with titration  2. Goal systolic blood pressure 120-140 3. Placing posy vest/lapbelt to keep patient in bed and prevent physical harm     Intervention Category Major Interventions: Delirium, psychosis, severe agitation - evaluation and management;Hypertension - evaluation and management  [name] 01/25/2016, 4:36 PM

## 2016-01-25 NOTE — Progress Notes (Signed)
Subjective: Off propofol for 45 minutes prior to exam. Off hypothermia for 48 hours.   Objective: Current vital signs: BP 103/58 mmHg  Pulse 103  Temp(Src) 98.4 F (36.9 C) (Core (Comment))  Resp 16  Ht  (1.626 m)  Wt 88.7 kg (195 lb 8.8 oz)  BMI 33.55 kg/m2  SpO2 100% Vital signs in last 24 hours: Temp:  [98.1 F (36.7 C)-99.1 F (37.3 C)] 98.4 F (36.9 C) (03/23 0800) Pulse Rate:  [89-150] 103 (03/23 0821) Resp:  [13-41] 16 (03/23 0821) BP: (86-250)/(41-201) 103/58 mmHg (03/23 0800) SpO2:  [98 %-100 %] 100 % (03/23 0821) Arterial Line BP: (103-221)/(46-97) 201/81 mmHg (03/23 0821) FiO2 (%):  [30 %-40 %] 40 % (03/23 0732) Weight:  [88.7 kg (195 lb 8.8 oz)] 88.7 kg (195 lb 8.8 oz) (03/23 0500)  Intake/Output from previous day: 03/22 0701 - 03/23 0700 In: 2081.6 [I.V.:673.7; NG/GT:1037.9; IV Piggyback:370] Out: 1735 [Urine:1735] Intake/Output this shift: Total I/O In: 86 [I.V.:31; NG/GT:55] Out: 75 [Urine:75] Nutritional status: Diet NPO time specified  Neurologic Exam: Mental Status: Patient does not respond to verbal stimuli. Does not respond to deep sternal rub. Does not follow commands. No attempts to communicate.   Cranial Nerves: II: patient does not respond confrontation bilaterally, pupils right 2 mm, left 2 mm and sluggishly reactive bilaterally III,IV,VI: doll's response present bilaterally. V,VII: corneal reflex present bilaterally  VIII: patient does not respond to verbal stimuli IX,X: gag reflex present, XI: trapezius strength unable to test bilaterally XII: tongue strength unable to test Motor/Sensory: Minimal withdrawal movements in UE and some in LE bilaterally to noxious.  Deep Tendon Reflexes: Absent throughout. Plantars: Mute bilaterally Cerebellar: Unable to perform  Lab Results: Basic Metabolic Panel:  Recent Labs Lab 01/22/16 0305  01/22/16 1940 01/23/16 0042 01/23/16 0350 01/24/16 0426 01/25/16 0556  NA 139  < > 140 140  139 136 139  K 2.9*  < > 3.1* 3.4* 3.5 4.7 3.7  CL 112*  < > 113* 112* 114* 113* 109  CO2 15*  < > 15* 15* 15* 16* 20*  GLUCOSE 160*  < > 118* 153* 145* 155* 166*  BUN 9  < > CREATININE 0.54  < > 0.57 0.67 0.78 0.82 0.70  CALCIUM 8.7*  < > 8.8* 8.9 8.6* 9.0 9.3  MG 1.7  --  2.2  --  1.9 1.7 1.6*  PHOS 1.7*  --  4.0  --  4.7* 4.0 3.3  < > = values in this interval not displayed.  Liver Function Tests:  Recent Labs Lab 01/23/2016 0658  AST 39  ALT 33  ALKPHOS 207*  BILITOT 1.8*  PROT 7.6  ALBUMIN 4.4    Recent Labs Lab 01-23-16 0658 01-23-2016 0911  LIPASE 16  --   AMYLASE  --  61   No results for input(s): AMMONIA in the last 168 hours.  CBC:  Recent Labs Lab 01-23-2016 1317 01/22/16 0305 01/22/16 0747 01/23/16 0350 01/24/16 0426 01/25/16 0556  WBC 12.3* 11.2*  --  11.2* 9.0 7.7  NEUTROABS 10.0*  --   --   --   --   --   HGB 12.8 13.6 14.6 12.4 11.4* 10.5*  HCT 41.1 41.7 43.0 38.9 36.8 33.2*  MCV 85.6 83.9  --  82.6 87.0 86.5  PLT 195 113*  --  142* 123* 116*    Cardiac Enzymes:  Recent Labs Lab 2016-01-23 1317 23-Jan-2016 1746 01-23-2016 2035 2016/01/23 2320  TROPONINI  0.42* 0.23* 0.13* 0.10*    Lipid Panel:  Recent Labs Lab 01/22/16 0305 01/25/16 0557  TRIG 135 153*    CBG:  Recent Labs Lab 01/24/16 1727 01/24/16 2112 01/24/16 2354 01/25/16 0540 01/25/16 0716  GLUCAP 183* 135* 119* 153* 182*    Microbiology: Results for orders placed or performed during the hospital encounter of 2016-01-26  MRSA PCR Screening     Status: None   Collection Time: 01-26-16 11:40 AM  Result Value Ref Range Status   MRSA by PCR NEGATIVE NEGATIVE Final    Comment:        The GeneXpert MRSA Assay (FDA approved for NASAL specimens only), is one component of a comprehensive MRSA colonization surveillance program. It is not intended to diagnose MRSA infection nor to guide or monitor treatment for MRSA infections.   Culture, respiratory  (NON-Expectorated)     Status: None (Preliminary result)   Collection Time: 01/24/16 10:40 PM  Result Value Ref Range Status   Specimen Description TRACHEAL ASPIRATE  Final   Special Requests Normal  Final   Gram Stain   Final    ABUNDANT WBC PRESENT,BOTH PMN AND MONONUCLEAR FEW SQUAMOUS EPITHELIAL CELLS PRESENT RARE GRAM POSITIVE COCCI IN PAIRS Performed at [facility]    Culture PENDING  Incomplete   Report Status PENDING  Incomplete    Coagulation Studies: No results for input(s): LABPROT, INR in the last 72 hours.  Imaging: Mr Brain Wo Contrast  01/24/2016  CLINICAL DATA:  Anoxic encephalopathy. Seizure-like activity. CPR. Ventricular fibrillation. EXAM: MRI HEAD WITHOUT CONTRAST TECHNIQUE: Multiplanar, multiecho pulse sequences of the brain and surrounding structures were obtained without intravenous contrast. COMPARISON:  CT head without contrast 01/26/2016. FINDINGS: The diffusion-weighted images demonstrate no evidence for acute or subacute infarction. There is slight increased signal within the caudate head bilaterally. A punctate area of T2 signal hyperintensity is noted adjacent to the right caudate head. Scattered subcortical T2 hyperintensities are present in the frontal lobes bilaterally. Mild white matter changes are noted within the brainstem. No acute hemorrhage or mass lesion is present. The ventricles are of normal size. No significant extra-axial fluid collection is present. Flow is present in the major intracranial arteries. The internal auditory canals are within normal limits. The cerebellum is unremarkable. The globes and orbits are intact. Mild circumferential mucosal thickening is present in the maxillary sinuses bilaterally. Fluid is present in the nasopharynx. The patient is intubated. Bilateral mastoid effusions are noted. IMPRESSION: 1. Subtle increased T2 signal within the caudate head bilaterally. This could be related to the recent hypoxic ischemic  episode. 2. No evidence for hemorrhage. 3. Mild white matter disease otherwise is more likely chronic. 4. Mild white matter changes extend into the brainstem. 5. Bilateral mastoid effusions and mild paranasal sinus disease is likely related to intubation. Electronically Signed   By: Marin Roberts M.D.   On: 01/24/2016 15:35   Dg Chest Port 1 View  01/25/2016  CLINICAL DATA:  Anoxic encephalopathy following cardiac arrest, respiratory failure, diabetes, pancreatitis. EXAM: PORTABLE CHEST 1 VIEW COMPARISON:  Portable chest x-ray of January 24, 2015 FINDINGS: The lungs are adequately inflated and clear. The heart and pulmonary vascularity are normal. The endotracheal tube tip lies approximately 4.5 cm above the carina. The esophagogastric tube tip projects in the gastric cardia with portions of the tube extending more inferiorly into the gastric fundus. The left internal jugular venous catheter tip projects over the proximal SVC. IMPRESSION: The support tubes are in reasonable position. Resolution  of pulmonary edema. Minimal atelectasis in the left lower lobe remains. Electronically Signed   By: David  SwazilandJordan M.D.   On: 01/25/2016 07:19   Dg Chest Port 1 View  01/24/2016  CLINICAL DATA:  Shortness of breath. EXAM: PORTABLE CHEST 1 VIEW COMPARISON:  01/23/2016. FINDINGS: Endotracheal tube, NG tube, left IJ line stable position. Cardiomegaly with normal pulmonary vascularity. Low lung volumes with basilar atelectasis. Continued improvement of basilar infiltrates. No pleural effusion or pneumothorax. IMPRESSION: 1. Lines and tubes in stable position. 2. Continued improvement of bibasilar infiltrates. Low lung volumes with mild bibasilar atelectasis. Electronically Signed   By: Maisie Fushomas  Register   On: 01/24/2016 07:12    Medications:   Current facility-administered medications:  .  0.9 %  sodium chloride infusion, 250 mL, Intravenous, PRN, Roslynn AmbleJennings E Nestor, MD, Last Rate: 10 mL/hr at 01/24/16 2000, 250 mL  at 01/24/16 2000 .  acetaminophen (TYLENOL) solution 650 mg, 650 mg, Oral, Q6H PRN, Roslynn AmbleJennings E Nestor, MD, 650 mg at 01/24/16 2117 .  antiseptic oral rinse solution (CORINZ), 7 mL, Mouth Rinse, 10 times per day, Roslynn AmbleJennings E Nestor, MD, 7 mL at 01/25/16 0550 .  artificial tears (LACRILUBE) ophthalmic ointment 1 application, 1 application, Both Eyes, 3 times per day, Vilinda BlanksWilliam S Minor, NP, 1 application at 01/25/16 0557 .  aspirin chewable tablet 81 mg, 81 mg, Per Tube, Daily, Laurey Moralealton S McLean, MD, 81 mg at 01/24/16 1043 .  chlorhexidine gluconate (PERIDEX) 0.12 % solution 15 mL, 15 mL, Mouth Rinse, BID, Roslynn AmbleJennings E Nestor, MD, 15 mL at 01/25/16 0750 .  feeding supplement (VITAL HIGH PROTEIN) liquid 1,000 mL, 1,000 mL, Per Tube, Q24H, Roslynn AmbleJennings E Nestor, MD, 1,000 mL at 01/25/16 0200 .  heparin injection 5,000 Units, 5,000 Units, Subcutaneous, 3 times per day, Vilinda BlanksWilliam S Minor, NP, 5,000 Units at 01/25/16 0549 .  hydrALAZINE (APRESOLINE) injection 10-20 mg, 10-20 mg, Intravenous, Q6H PRN, Leslye Peerobert S Byrum, MD, 20 mg at 01/25/16 [id number] .  insulin aspart (novoLOG) injection 0-9 Units, 0-9 Units, Subcutaneous, 6 times per day, Karl ItoSteven E Sommer, MD, 2 Units at 01/25/16 0750 .  levETIRAcetam (KEPPRA) 1,000 mg in sodium chloride 0.9 % 100 mL IVPB, 1,000 mg, Intravenous, BID, Roslynn AmbleJennings E Nestor, MD, 1,000 mg at 01/24/16 2118 .  LORazepam (ATIVAN) injection 2 mg, 2 mg, Intravenous, Q4H PRN, Erin FullingKurian Kasa, MD, 2 mg at 01/25/16 0336 .  metoprolol (LOPRESSOR) injection 5 mg, 5 mg, Intravenous, 4 times per day, Luis AbedJeffrey D Katz, MD, 5 mg at 01/25/16 0810 .  norepinephrine (LEVOPHED) 4 mg in dextrose 5 % 250 mL (0.016 mg/mL) infusion, 0-50 mcg/min, Intravenous, Titrated, Vilinda BlanksWilliam S Minor, NP, Stopped at 01/23/16 1156 .  pantoprazole sodium (PROTONIX) 40 mg/20 mL oral suspension 40 mg, 40 mg, Per Tube, QHS, Earnie LarssonFrank R Wilson, RPH, 40 mg at 01/24/16 2117 .  piperacillin-tazobactam (ZOSYN) IVPB 3.375 g, 3.375 g, Intravenous, 3 times per  day, Ancil Boozeraylor P Stone, RPH, 3.375 g at 01/25/16 0551 .  pneumococcal 23 valent vaccine (PNU-IMMUNE) injection 0.5 mL, 0.5 mL, Intramuscular, Tomorrow-1000, Roslynn AmbleJennings E Nestor, MD, Stopped at 01/23/16 1000 .  propofol (DIPRIVAN) 1000 MG/100ML infusion, 5-80 mcg/kg/min, Intravenous, Titrated, Roslynn AmbleJennings E Nestor, MD, Stopped at 01/25/16 0750 .  propofol (DIPRIVAN) bolus via infusion 20 mg, 20 mg, Intravenous, Q1H PRN, Roslynn AmbleJennings E Nestor, MD .  sodium chloride flush (NS) 0.9 % injection 10-40 mL, 10-40 mL, Intracatheter, Q12H, William S Minor, NP, 10 mL at 01/24/16 2123 .  sodium chloride flush (NS) 0.9 % injection 10-40  mL, 10-40 mL, Intracatheter, PRN, Vilinda Blanks Minor, NP   Assessment: 1. Overall clinical picture most consistent with anoxic brain injury.  2. History of alcohol abuse and pancreatitis.   Recommendations: 1. Keep off sedation for 72 hours. 2. Neurological exam on Sunday (after being off all sedation for 72 hours) for prognostication.  3. Discussed with her HCPOA.   Caryl Pina, MD  01/25/2016, 8:45 AM

## 2016-01-25 NOTE — Progress Notes (Signed)
Patient blood pressure still systolic >220s even with prn hydralazine dose. CCM does not want any sedation, called [facility], Dr. Jamison NeighborNestor aware, orders to start cardene gtt. Will continue to monitor closely.

## 2016-01-25 NOTE — Progress Notes (Signed)
Patient Name:  Alyssa Garcia, DOB: 07/04/1961, MRN: [id number] Primary Doctor: Pcp Not In System Primary Cardiologist:   Date: 01/25/2016   SUBJECTIVE  patient remains unresponsive   Past Medical History  Diagnosis Date  . Depression   . Hyperlipidemia   . Diabetes mellitus without complication (HCC)   . Pancreatitis     Secondary to EtOH   Filed Vitals:   01/25/16 0732 01/25/16 0800 01/25/16 0821 01/25/16 0900  BP:  103/58  124/66  Pulse: 100 98 103 107  Temp:  98.4 F (36.9 C)  99 F (37.2 C)  TempSrc:  Core (Comment)  Core (Comment)  Resp: 21 24 16 17   Height:      Weight:      SpO2: 100% 100% 100% 100%    Intake/Output Summary (Last 24 hours) at 01/25/16 0945 Last data filed at 01/25/16 0900  Gross per 24 hour  Intake 2022.63 ml  Output   1625 ml  Net 397.63 ml   Filed Weights   01/23/16 0425 01/24/16 0418 01/25/16 0500  Weight: 196 lb 2.6 oz (88.98 kg) 206 lb 2.1 oz (93.5 kg) 195 lb 8.8 oz (88.7 kg)     LABS: Basic Metabolic Panel:  Recent Labs  14/78/29 0426 01/25/16 0556  NA 136 139  K 4.7 3.7  CL 113* 109  CO2 16* 20*  GLUCOSE 155* 166*  BUN 9 10  CREATININE 0.82 0.70  CALCIUM 9.0 9.3  MG 1.7 1.6*  PHOS 4.0 3.3   Liver Function Tests: No results for input(s): AST, ALT, ALKPHOS, BILITOT, PROT, ALBUMIN in the last 72 hours. No results for input(s): LIPASE, AMYLASE in the last 72 hours. CBC:  Recent Labs  01/24/16 0426 01/25/16 0556  WBC 9.0 7.7  HGB 11.4* 10.5*  HCT 36.8 33.2*  MCV 87.0 86.5  PLT 123* 116*   Cardiac Enzymes: No results for input(s): CKTOTAL, CKMB, CKMBINDEX, TROPONINI in the last 72 hours. BNP: Invalid input(s): POCBNP D-Dimer: No results for input(s): DDIMER in the last 72 hours. Thyroid Function Tests: No results for input(s): TSH, T4TOTAL, T3FREE, THYROIDAB in the last 72 hours.  Invalid input(s): FREET3  RADIOLOGY: Ct Head Wo Contrast  01/16/2016  CLINICAL DATA:  Altered mental status with  seizure. Recent cardiopulmonary arrest EXAM: CT HEAD WITHOUT CONTRAST TECHNIQUE: Contiguous axial images were obtained from the base of the skull through the vertex without intravenous contrast. COMPARISON:  None. FINDINGS: The ventricles are normal in size and configuration. There is no intracranial mass hemorrhage, extra-axial fluid collection, or midline shift. The gray-white compartments are normal. There is no acute infarct evident. Gray-white differentiation appears normal. Bony calvarium appears intact. The mastoid air cells are clear. No intraorbital lesions are identified. There is mucosal thickening in the maxillary antra bilaterally as well as in several ethmoid air cells bilaterally. IMPRESSION: Mild paranasal sinus disease. No intracranial mass, hemorrhage, or edema. No acute infarct evident. Electronically Signed   By: Bretta Bang III M.D.   On: 01/16/2016 10:15   Ct Angio Chest Pe W/cm &/or Wo Cm  01/29/2016  CLINICAL DATA:  Shortness of breath/respiratory failure EXAM: CT ANGIOGRAPHY CHEST WITH CONTRAST TECHNIQUE: Multidetector CT imaging of the chest was performed using the standard protocol during bolus administration of intravenous contrast. Multiplanar CT image reconstructions and MIPs were obtained to evaluate the vascular anatomy. CONTRAST:  OMNIPAQUE IOHEXOL 350 MG/ML SOLN COMPARISON:  Chest radiograph January 21, 2016 FINDINGS: Mediastinum/Lymph Nodes: There is no demonstrable pulmonary  embolus. There is no thoracic aortic aneurysm or dissection. The visualized great vessels appear unremarkable. There are foci of coronary artery calcification. The pericardium is not thickened. Visualized thyroid appears normal. There is no appreciable thoracic adenopathy. Lungs/Pleura: There is patchy atelectasis in both lower lobes with lesser atelectasis noted throughout the lungs bilaterally. There is focal airspace consolidation in superior and medial segments of the left lower lobe  concerning for pneumonia in these areas. Endotracheal tube tip is above the carina. No pneumothorax. Upper abdomen: Nasogastric tube extends into the stomach and loops back on itself with the tip in the distal esophagus. Visualized upper abdominal structures otherwise appear unremarkable. Musculoskeletal: There are foci of degenerative change in the thoracic spine. There are no blastic or lytic bone lesions. Review of the MIP images confirms the above findings. IMPRESSION: No demonstrable pulmonary embolus. Focal consolidation concerning for pneumonia in portions of the superior and medial segments of the left lower lobe. There is also bibasilar atelectasis. Nasogastric tube extends into the stomach where it loops back upon itself with the tip in the distal esophagus. No demonstrable adenopathy. These results will be called to the ordering clinician or representative by the Radiologist Assistant, and communication documented in the PACS or zVision Dashboard. Electronically Signed   By: Bretta BangWilliam  Woodruff III M.D.   On: 2016/07/28 10:29   Mr Brain Wo Contrast  01/24/2016  CLINICAL DATA:  Anoxic encephalopathy. Seizure-like activity. CPR. Ventricular fibrillation. EXAM: MRI HEAD WITHOUT CONTRAST TECHNIQUE: Multiplanar, multiecho pulse sequences of the brain and surrounding structures were obtained without intravenous contrast. COMPARISON:  CT head without contrast 2016/07/28. FINDINGS: The diffusion-weighted images demonstrate no evidence for acute or subacute infarction. There is slight increased signal within the caudate head bilaterally. A punctate area of T2 signal hyperintensity is noted adjacent to the right caudate head. Scattered subcortical T2 hyperintensities are present in the frontal lobes bilaterally. Mild white matter changes are noted within the brainstem. No acute hemorrhage or mass lesion is present. The ventricles are of normal size. No significant extra-axial fluid collection is present. Flow is  present in the major intracranial arteries. The internal auditory canals are within normal limits. The cerebellum is unremarkable. The globes and orbits are intact. Mild circumferential mucosal thickening is present in the maxillary sinuses bilaterally. Fluid is present in the nasopharynx. The patient is intubated. Bilateral mastoid effusions are noted. IMPRESSION: 1. Subtle increased T2 signal within the caudate head bilaterally. This could be related to the recent hypoxic ischemic episode. 2. No evidence for hemorrhage. 3. Mild white matter disease otherwise is more likely chronic. 4. Mild white matter changes extend into the brainstem. 5. Bilateral mastoid effusions and mild paranasal sinus disease is likely related to intubation. Electronically Signed   By: Marin Robertshristopher  Mattern M.D.   On: 01/24/2016 15:35   Ct Abdomen Pelvis W Contrast  12/31/2015  CLINICAL DATA:  Mid abdominal pain with nausea vomiting and diarrhea. Elevated serum lipase level EXAM: CT ABDOMEN AND PELVIS WITH CONTRAST TECHNIQUE: Multidetector CT imaging of the abdomen and pelvis was performed using the standard protocol following bolus administration of intravenous contrast. CONTRAST:  100mL OMNIPAQUE IOHEXOL 300 MG/ML SOLN, 25mL OMNIPAQUE IOHEXOL 300 MG/ML SOLN COMPARISON:  12/31/2015 ; report dated 02/06/2000 FINDINGS: Lower chest:  Mild cardiomegaly. Hepatobiliary: Mild diffuse hepatic steatosis. Pancreas: Diffuse peripancreatic stranding compatible with acute pancreatitis. No findings of necrosis, abscess, or pseudocyst. Spleen: Unremarkable Adrenals/Urinary Tract: Unremarkable Stomach/Bowel: Unremarkable Vascular/Lymphatic: Upper peripancreatic lymph node 1.5 cm in short axis on image 22  series 2. Portacaval node 1.3 cm in short axis, image 24 series 2. Porta hepatis node 1.3 cm in short axis, image 27 series 2. These lymph nodes are likely reactive. Reproductive: Unremarkable Other: Small amount of perihepatic and perisplenic ascites.  Trace fluid in the paracolic gutters. Musculoskeletal: Lower lumbar spondylosis and degenerative disc disease. A transitional lumbosacral vertebra is assumed to represent the S1 level. Careful correlation with this numbering strategy prior to any procedural intervention would be recommended. There is potentially prominent central narrowing of the thecal sac at the L4-5 level especially eccentric to the left due to disc bulge and disc protrusion along with at least mild left foraminal stenosis at this level. There is also intervertebral spurring at the L5-S1 level. Infraumbilical hernia contains adipose tissue. IMPRESSION: 1. Acute pancreatitis. No pancreatic abscess, pseudocyst, or necrosis. Peripancreatic stranding observed with a small amount of perihepatic and perisplenic ascites. Surrounding porta hepatis reactive lymph nodes. 2. Lumbar spondylosis and degenerative disc disease potentially with considerable impingement at the L4-5 level (please note that S1 is transitional). 3. Infraumbilical hernia contains adipose tissue. 4. Mild cardiomegaly. 5. Mild diffuse hepatic steatosis. Electronically Signed   By: Gaylyn Rong M.D.   On: 12/31/2015 17:04   Dg Chest Port 1 View  01/25/2016  CLINICAL DATA:  Anoxic encephalopathy following cardiac arrest, respiratory failure, diabetes, pancreatitis. EXAM: PORTABLE CHEST 1 VIEW COMPARISON:  Portable chest x-ray of January 24, 2015 FINDINGS: The lungs are adequately inflated and clear. The heart and pulmonary vascularity are normal. The endotracheal tube tip lies approximately 4.5 cm above the carina. The esophagogastric tube tip projects in the gastric cardia with portions of the tube extending more inferiorly into the gastric fundus. The left internal jugular venous catheter tip projects over the proximal SVC. IMPRESSION: The support tubes are in reasonable position. Resolution of pulmonary edema. Minimal atelectasis in the left lower lobe remains. Electronically  Signed   By: David  [place] M.D.   On: 01/25/2016 07:19   Dg Chest Port 1 View  01/24/2016  CLINICAL DATA:  Shortness of breath. EXAM: PORTABLE CHEST 1 VIEW COMPARISON:  01/23/2016. FINDINGS: Endotracheal tube, NG tube, left IJ line stable position. Cardiomegaly with normal pulmonary vascularity. Low lung volumes with basilar atelectasis. Continued improvement of basilar infiltrates. No pleural effusion or pneumothorax. IMPRESSION: 1. Lines and tubes in stable position. 2. Continued improvement of bibasilar infiltrates. Low lung volumes with mild bibasilar atelectasis. Electronically Signed   By: Maisie Fus  Register   On: 01/24/2016 07:12   Dg Chest Port 1 View  01/23/2016  CLINICAL DATA:  Shortness of breath. EXAM: PORTABLE CHEST 1 VIEW COMPARISON:  01/23/2016 . FINDINGS: Endotracheal tube, NG tube, left IJ line stable position. Heart size stable. Bibasilar pulmonary infiltrates, slightly improved from prior exam noted. Small left pleural effusion. No pneumothorax. IMPRESSION: 1. Lines and tubes in stable position. 2. Slight improvement of bibasilar pulmonary infiltrates. Small left pleural effusion again noted. Electronically Signed   By: Maisie Fus  Register   On: 01/23/2016 07:17   Dg Chest Port 1 View  01/20/2016  CLINICAL DATA:  Central line placement and shortness of breath EXAM: PORTABLE CHEST 1 VIEW COMPARISON:  01/24/2016 FINDINGS: Endotracheal tube tip is 2.7 cm above the carina. There is a left internal jugular central line with tip over the superior vena cava. Mild cardiac enlargement persists. There is now bilateral perihilar and lower lobe opacity. Possible small left pleural effusion. IMPRESSION: Bilateral perihilar infiltrates concerning for pulmonary edema. Bilateral pneumonia not excluded. Lines and  tubes as described above. Electronically Signed   By: Esperanza Heir M.D.   On: Feb 19, 2016 18:11   Dg Chest Portable 1 View  February 19, 2016  CLINICAL DATA:  Hypoxia EXAM: PORTABLE CHEST 1 VIEW  COMPARISON:  December 31, 2015 FINDINGS: Endotracheal tube tip is 5.2 cm above the carina. No pneumothorax. No edema or consolidation. Heart is upper normal in size with pulmonary vascularity within normal limits. No adenopathy. No bone lesions. IMPRESSION: Endotracheal tube as described without pneumothorax. No edema or consolidation. Stable cardiac silhouette. Electronically Signed   By: Bretta Bang III M.D.   On: Feb 19, 2016 08:39   Dg Abd Acute W/chest  12/31/2015  CLINICAL DATA:  Patient with diffuse abdominal pain, nausea and vomiting. EXAM: DG ABDOMEN ACUTE W/ 1V CHEST COMPARISON:  None. FINDINGS: Normal cardiac and mediastinal contours. No consolidative pulmonary opacities. No pleural effusion or pneumothorax. Gas is demonstrated within nondilated loops of large and small bowel in a nonobstructed pattern. No free intraperitoneal air. Unremarkable osseous skeleton. IMPRESSION: No acute cardiopulmonary process. Nonobstructed bowel gas pattern. Electronically Signed   By: Annia Belt M.D.   On: 12/31/2015 12:05     ASSESSMENT AND PLAN: 55 yo with history of ETOH abuse, ETOH pancreatitis, type II diabetes is seen after cardiac arrest. She was just discharged on 3/1 from hospitalization for ETOH pancreatitis.   Cardiac arrest: Occurred in the setting of nausea/vomiting and an apparent seizure after getting Dilaudid. Initial rhythm was slow PEA but had vfib at one point. It is possible that the initial PEA arrest was triggered by aspiration in the setting of vomiting and seizing. Continued hypothermic protocol with cooling now turned off. Slight troponin elevation trending down. Patient was not taken to the cath lab acutely. Suspicion for aspiration as trigger for PEA event and do not know what the GI process is that was causing the nausea/vomiting and abdominal pain.   - Echo showed EF 20% with diffuse hypokinesis. Uncertain etiology => could be due to heavy ETOH history or stunning  from arrest. Also could be ischemic cardiomyopathy.      At this point there is no improvement in her mental status.    Cardiomyopathy:, EF 20% with diffuse hypokinesis, per original bedside reviewed by Dr. Shirlee Latch. Etiology uncertain => ETOH cardiomyopathy versus stunning from arrest versus ischemic CMP. Cardiac catheterization would've been considered if the patient's mental status had improved. For now no further cardiac workup.    Altered mental status  Neurology is assessing. She is not responsive at this time. Neurology will reassess the patient in 72 hours to make final decisions about her mental status.   Respiratory failure (HCC)  The patient is still intubated.  The patient remains unresponsive. She is being managed completely by radical care medicine and neurology. Cardiology will sign off.   Willa Rough 01/25/2016 9:45 AM

## 2016-01-25 NOTE — Progress Notes (Signed)
Pharmacy Antibiotic Note  Alyssa Garcia is a 55 y.o. female admitted on 01/18/2016 with probable aspiration pneumonia.  Pharmacy has been consulted for zosyn dosing.  Tn 99.1 s/p hypothermia protocol, WBC WNL.  Cultures still growing.  Plan: - Zosyn 3.375g IV q8h (4hr infusion) - Monitor C&S, CBC, renal function and clinical progression  Temp (24hrs), Avg:98.5 F (36.9 C), Min:98.1 F (36.7 C), Max:99.1 F (37.3 C)   Recent Labs Lab 01/13/2016 1317 01/12/2016 1555  01/10/2016 2030  01/13/2016 2350 01/22/16 0305  01/22/16 1940 01/23/16 0042 01/23/16 0350 01/24/16 0426 01/25/16 0556  WBC 12.3*  --   --   --   --   --  11.2*  --   --   --  11.2* 9.0 7.7  CREATININE 1.02* 0.72  < >  --   < >  --  0.54  < > 0.57 0.67 0.78 0.82 0.70  LATICACIDVEN 1.6 0.6  --  1.0  --  0.9 1.5  --   --   --   --   --   --   < > = values in this interval not displayed.  Estimated Creatinine Clearance: 85.7 mL/min (by C-G formula based on Cr of 0.7).    No Known Allergies  Antimicrobials this admission: Zosyn 3/19>>  Microbiology results: 3/19 UA - Many bacteria, Neg  leuko, Neg nitrite, 6-30 WBC 3/19 MRSA PCR: neg 3/22 UCx > 3/22 BCx x 2 > 3/22 Sputum > pending  Thank you for allowing pharmacy to be a part of this patient's care.  Tad MooreJessica Sheena Simonis, Pharm D, BCPS  Clinical Pharmacist Pager [phone]  01/25/2016 10:38 AM

## 2016-01-25 NOTE — Progress Notes (Signed)
PULMONARY / CRITICAL CARE MEDICINE   Name: Asencion GowdaLeslie A Buttacavoli MRN: [id number] DOB: 09/17/1961    ADMISSION DATE:  2016-08-08 CONSULTATION DATE:  2016-08-08  REF[facility]ING [facility]:  [facility]  CHIEF COMPLAINT:  CARDIAC ARREST  HISTORY OF PRESENT ILLNESS:   Patient was discharged on 3/1 from hospital after being treated for alcoholic pancreatitis. At presentation nurse report documented shows she began having abdominal pain on Friday evening with nausea and vomiting. The emesis had progressed. During IV push of Dilaudid patient began having seizure-like activity. Emergency department physician was contacted and convulsions lasted approximately 1 minute. This progressed to a stair and slow rhythm. Respirations became agonal and patient became pulseless. CPR was initiated. Per documentation patient initially was in coarse V. fib and shock was administered. Patient was subsequently found to be in pulseless electrical activity. Epinephrine was administered twice as well as 150 mg of amiodarone. Family at bedside reports that the patient had only been complaining of abdominal pain with nausea and vomiting. She had not reported any difficulty breathing. The only additional thing of note the patient had missed some of her blood pressure medications.  SUBJECTIVE: No events overnight.  Unresponsive and posturing.  VITAL SIGNS: BP 139/108 mmHg  Pulse 120  Temp(Src) 99.3 F (37.4 C) (Axillary)  Resp 16  Ht 5\' 4"  (1.626 m)  Wt 88.7 kg (195 lb 8.8 oz)  BMI 33.55 kg/m2  SpO2 100%  HEMODYNAMICS: CVP:  [3 mmHg-8 mmHg] 8 mmHg  VENTILATOR SETTINGS: Vent Mode:  [-] PRVC FiO2 (%):  [30 %-40 %] 40 % Set Rate:  [24 bmp] 24 bmp Vt Set:  [450 mL] 450 mL PEEP:  [5 cmH20] 5 cmH20 Plateau Pressure:  [10 cmH20-15 cmH20] 15 cmH20  INTAKE / OUTPUT: I/O last 3 completed shifts: In: 2957.3 [I.V.:973.7; NG/GT:1403.6; IV Piggyback:580] Out: 2585 [Urine:2585]  PHYSICAL EXAMINATION: General:  No acute distress. Off  sedation, unresponsive. Integument:  Warm & dry. No rash on exposed skin.  Lymphatics:  No appreciated cervical or supraclavicular lymphadenoapthy. HEENT:  No scleral injection or icterus.  Endotracheal tube in place. PERRL. Cardiovascular:  Regular rate. No edema. No appreciable JVD.  Pulmonary:  Good aeration & clear to auscultation bilaterally. Symmetric chest wall rise on ventilator. Abdomen: Soft. Normal bowel sounds. Protuberant. Musculoskeletal:  Normal bulk. Hand grip strength 5/5 bilaterally. No joint deformity or effusion appreciated. Neurological:  Gag and respiratory drive intact, pupils are sluggish but reactive, posturing. Psychiatric:  Unable to assess given altered mental status.  LABS:  BMET  Recent Labs Lab 01/23/16 0350 01/24/16 0426 01/25/16 0556  NA 139 136 139  K 3.5 4.7 3.7  CL 114* 113* 109  CO2 15* 16* 20*  BUN 10 9 10   CREATININE 0.78 0.82 0.70  GLUCOSE 145* 155* 166*   Electrolytes  Recent Labs Lab 01/23/16 0350 01/24/16 0426 01/25/16 0556  CALCIUM 8.6* 9.0 9.3  MG 1.9 1.7 1.6*  PHOS 4.7* 4.0 3.3   CBC  Recent Labs Lab 01/23/16 0350 01/24/16 0426 01/25/16 0556  WBC 11.2* 9.0 7.7  HGB 12.4 11.4* 10.5*  HCT 38.9 36.8 33.2*  PLT 142* 123* 116*   Coag's  Recent Labs Lab 01/09/16 0825 01/09/16 1317 01/09/16 2120  APTT 27 28 29   INR 1.27 1.22 1.27   Sepsis Markers  Recent Labs Lab 01/09/16 0911  01/09/16 2030 01/09/16 2350 01/22/16 0305 01/23/16 0350  LATICACIDVEN  --   < > 1.0 0.9 1.5  --   PROCALCITON <0.10  --   --   --  0.57 0.74  < > = values in this interval not displayed.  ABG  Recent Labs Lab 01/23/16 0350 01/24/16 0315 01/25/16 0332  PHART 7.408 7.314* 7.352  PCO2ART 24.7* 32.2* 32.3*  PO2ART 147* 187* 200*   Liver Enzymes  Recent Labs Lab Jan 26, 2016 0658  AST 39  ALT 33  ALKPHOS 207*  BILITOT 1.8*  ALBUMIN 4.4   Cardiac Enzymes  Recent Labs Lab 01-26-16 1746 2016-01-26 2035 01-26-2016 2320   TROPONINI 0.23* 0.13* 0.10*   Glucose  Recent Labs Lab 01/24/16 1727 01/24/16 2112 01/24/16 2354 01/25/16 0540 01/25/16 0716 01/25/16 1134  GLUCAP 183* 135* 119* 153* 182* 207*   Imaging Mr Brain Wo Contrast  01/24/2016  CLINICAL DATA:  Anoxic encephalopathy. Seizure-like activity. CPR. Ventricular fibrillation. EXAM: MRI HEAD WITHOUT CONTRAST TECHNIQUE: Multiplanar, multiecho pulse sequences of the brain and surrounding structures were obtained without intravenous contrast. COMPARISON:  CT head without contrast Jan 26, 2016. FINDINGS: The diffusion-weighted images demonstrate no evidence for acute or subacute infarction. There is slight increased signal within the caudate head bilaterally. A punctate area of T2 signal hyperintensity is noted adjacent to the right caudate head. Scattered subcortical T2 hyperintensities are present in the frontal lobes bilaterally. Mild white matter changes are noted within the brainstem. No acute hemorrhage or mass lesion is present. The ventricles are of normal size. No significant extra-axial fluid collection is present. Flow is present in the major intracranial arteries. The internal auditory canals are within normal limits. The cerebellum is unremarkable. The globes and orbits are intact. Mild circumferential mucosal thickening is present in the maxillary sinuses bilaterally. Fluid is present in the nasopharynx. The patient is intubated. Bilateral mastoid effusions are noted. IMPRESSION: 1. Subtle increased T2 signal within the caudate head bilaterally. This could be related to the recent hypoxic ischemic episode. 2. No evidence for hemorrhage. 3. Mild white matter disease otherwise is more likely chronic. 4. Mild white matter changes extend into the brainstem. 5. Bilateral mastoid effusions and mild paranasal sinus disease is likely related to intubation. Electronically Signed   By: Marin Roberts M.D.   On: 01/24/2016 15:35   Dg Chest Port 1  View  01/25/2016  CLINICAL DATA:  Anoxic encephalopathy following cardiac arrest, respiratory failure, diabetes, pancreatitis. EXAM: PORTABLE CHEST 1 VIEW COMPARISON:  Portable chest x-ray of January 24, 2015 FINDINGS: The lungs are adequately inflated and clear. The heart and pulmonary vascularity are normal. The endotracheal tube tip lies approximately 4.5 cm above the carina. The esophagogastric tube tip projects in the gastric cardia with portions of the tube extending more inferiorly into the gastric fundus. The left internal jugular venous catheter tip projects over the proximal SVC. IMPRESSION: The support tubes are in reasonable position. Resolution of pulmonary edema. Minimal atelectasis in the left lower lobe remains. Electronically Signed   By: David  [place] M.D.   On: 01/25/2016 07:19   STUDIES:  Port CXR 3/19:  Personally reviewed by me. ETT too high. No focal opacity or effusion. EKG 3/19:  Sinus tachycardia. QTc . LAE. ST segment depression in lateral leads.  CULTURES: MRSA PCR 3/19>>>  ANTIBIOTICS: Zosyn 3/19>>>.  SIGNIFICANT EVENTS: 3/01 - Discharge after hospitalization for alcoholic pancreatitis & hypertensive urgency  LINES/TUBES: OETT 7.0 3/19>>> OGT 3/19>>> FOLEY 3/19>>> PIV x2>>>  ASSESSMENT / PLAN:  PULMONARY A: Acute Respiratory Failure - Post arrest.  P:   Full Vent Support Holding on SBT until/if mental status improves CTA chest neg for PE, positive for pulmonary infiltrate. Zosyn.  CARDIOVASCULAR A:  Cardiac Arrest H/O HTN  P:  Telemetry monitoring Vitals per unit Protocol ASA Cardiology consult appreciated TTE per cards Holding outpatient Lisinopril & Lopressor  RENAL A:   Hypomag.  P:   Monitor renal function w/ Foley. Trending BUN/Creatinine & Electrolytes daily. Replace electrolytes as indicated.  GASTROINTESTINAL A:   H/O Pancreatitis - Secondary to EtOH. Lipase normal.  P:   Protonix IV daily. TF per  nutrition.  HEMATOLOGIC A:   No acute issues.  P:  Trend cell counts daily w/ CBC SCDs Heparin Elfers q8hr  INFECTIOUS A:   No acute issue.  P:   Monitor for signs of infection/leukocytosis  ENDOCRINE A:   H/O DM Type II - A1c  6.7 on 3/2.  P:   Accu-Checks q4hr while NPO, will start TF. SSI per low dose algorithm Holding outpatient Metformin  NEUROLOGIC A:   Possible Seizure - During IV Dilaudid push in ED. Sedation on Ventilator H/O Depression  P:   RASS goal: 0 to -1 (when not on paralytics) D/C versed/fentanly/nimbex. EEG Flat. CT Head WNL MRI with T2 bilateral caudate nucleus injury consistent with anoxic injury. Holding outpatient Prozac & Wellbutrin. Neurology consult appreciated, tomorrow AM will be prognostication time and will plan a family meeting in AM.  FAMILY  - Updates:  Family updated bedside.  - Inter-disciplinary family meet or Palliative Care meeting due by:  3/26  The patient is critically ill with multiple organ systems failure and requires high complexity decision making for assessment and support, frequent evaluation and titration of therapies, application of advanced monitoring technologies and extensive interpretation of multiple databases.   Critical Care Time devoted to patient care services described in this note is  35  Minutes. This time reflects time of care of this signee Dr Koren Bound. This critical care time does not reflect procedure time, or teaching time or supervisory time of PA/NP/Med student/Med Resident etc but could involve care discussion time.  Alyson Reedy, M.D. [facility] Pulmonary/Critical Care Medicine. Pager: [phone]. After hours pager: [phone].

## 2016-01-26 ENCOUNTER — Inpatient Hospital Stay (HOSPITAL_COMMUNITY): Payer: BLUE CROSS/BLUE SHIELD

## 2016-01-26 LAB — BASIC METABOLIC PANEL
Anion gap: 10 (ref 5–15)
BUN: 9 mg/dL (ref 6–20)
CO2: 22 mmol/L (ref 22–32)
Calcium: 9.4 mg/dL (ref 8.9–10.3)
Chloride: 109 mmol/L (ref 101–111)
Creatinine, Ser: 0.61 mg/dL (ref 0.44–1.00)
GFR calc Af Amer: >60 mL/min (ref >60)
Glucose, Bld: 300 mg/dL — ABNORMAL HIGH (ref 65–99)
POTASSIUM: 3.1 mmol/L — AB (ref 3.5–5.1)
SODIUM: 141 mmol/L (ref 135–145)

## 2016-01-26 LAB — BLOOD GAS, ARTERIAL
ACID-BASE DEFICIT: 1.6 mmol/L (ref 0.0–2.0)
Bicarbonate: 22.3 mEq/L (ref 20.0–24.0)
DRAWN BY: 42624
FIO2: 0.4
O2 SAT: 99.4 %
PATIENT TEMPERATURE: 98.6
PCO2 ART: 35.3 mmHg (ref 35.0–45.0)
PEEP: 5 cmH2O
PH ART: 7.416 (ref 7.350–7.450)
PO2 ART: 153 mmHg — AB (ref 80.0–100.0)
RATE: 24 resp/min
TCO2: 23.4 mmol/L (ref 0–100)
VT: 450 mL

## 2016-01-26 LAB — CBC
HCT: 35.3 % — ABNORMAL LOW (ref 36.0–46.0)
Hemoglobin: 10.9 g/dL — ABNORMAL LOW (ref 12.0–15.0)
MCH: 26.5 pg (ref 26.0–34.0)
MCHC: 30.9 g/dL (ref 30.0–36.0)
MCV: 85.9 fL (ref 78.0–100.0)
PLATELETS: 143 10*3/uL — AB (ref 150–400)
RBC: 4.11 MIL/uL (ref 3.87–5.11)
RDW: 15.5 % (ref 11.5–15.5)
WBC: 8.9 10*3/uL (ref 4.0–10.5)

## 2016-01-26 LAB — PHOSPHORUS: Phosphorus: 2.6 mg/dL (ref 2.5–4.6)

## 2016-01-26 LAB — GLUCOSE, CAPILLARY
Glucose-Capillary: 268 mg/dL — ABNORMAL HIGH (ref 65–99)
Glucose-Capillary: 270 mg/dL — ABNORMAL HIGH (ref 65–99)
Glucose-Capillary: 289 mg/dL — ABNORMAL HIGH (ref 65–99)

## 2016-01-26 LAB — URINE CULTURE
CULTURE: NO GROWTH
Special Requests: NORMAL

## 2016-01-26 LAB — MAGNESIUM: Magnesium: 1.7 mg/dL (ref 1.7–2.4)

## 2016-01-26 LAB — TRIGLYCERIDES: Triglycerides: 149 mg/dL

## 2016-01-26 MED ORDER — MORPHINE BOLUS VIA INFUSION
5.0000 mg | INTRAVENOUS | Status: DC | PRN
  Administered 2016-01-26 – 2016-01-29 (×21): 10 mg via INTRAVENOUS
  Administered 2016-01-29: 5 mg via INTRAVENOUS
  Administered 2016-01-29: 10 mg via INTRAVENOUS
  Administered 2016-01-29: 5 mg via INTRAVENOUS
  Filled 2016-01-26 (×25): qty 20

## 2016-01-26 MED ORDER — POTASSIUM CHLORIDE 20 MEQ/15ML (10%) PO SOLN
30.0000 meq | ORAL | Status: DC
  Administered 2016-01-26: 30 meq
  Filled 2016-01-26: qty 30

## 2016-01-26 MED ORDER — MORPHINE SULFATE 25 MG/ML IV SOLN
10.0000 mg/h | INTRAVENOUS | Status: DC
  Administered 2016-01-26: 10 mg/h via INTRAVENOUS
  Administered 2016-01-27: 17 mg/h via INTRAVENOUS
  Administered 2016-01-27 – 2016-01-29 (×4): 20 mg/h via INTRAVENOUS
  Administered 2016-01-29: 30 mg/h via INTRAVENOUS
  Administered 2016-01-29: 12.5 mg/h via INTRAVENOUS
  Administered 2016-01-29: 25 mg/h via INTRAVENOUS
  Filled 2016-01-26 (×8): qty 10

## 2016-01-26 NOTE — Progress Notes (Addendum)
PULMONARY / CRITICAL CARE MEDICINE   Name: Alyssa Garcia MRN: [id number] DOB: [birth year]    ADMISSION DATE:  02-10-2016 CONSULTATION DATE:  02-10-16  REFERRING MD:  [facility]  CHIEF COMPLAINT:  CARDIAC ARREST  HISTORY OF PRESENT ILLNESS:   Patient was discharged on 3/1 from hospital after being treated for alcoholic pancreatitis. At presentation nurse report documented shows she began having abdominal pain on Friday evening with nausea and vomiting. The emesis had progressed. During IV push of Dilaudid patient began having seizure-like activity. Emergency department physician was contacted and convulsions lasted approximately 1 minute. This progressed to a stair and slow rhythm. Respirations became agonal and patient became pulseless. CPR was initiated. Per documentation patient initially was in coarse V. fib and shock was administered. Patient was subsequently found to be in pulseless electrical activity. Epinephrine was administered twice as well as 150 mg of amiodarone. Family at bedside reports that the patient had only been complaining of abdominal pain with nausea and vomiting. She had not reported any difficulty breathing. The only additional thing of note the patient had missed some of her blood pressure medications.  SUBJECTIVE: No events overnight, remains unresponsive.  VITAL SIGNS: BP 128/68 mmHg  Pulse 119  Temp(Src) 99.3 F (37.4 C) (Core (Comment))  Resp 27  Ht  (1.626 m)  Wt 89.1 kg (196 lb 6.9 oz)  BMI 33.70 kg/m2  SpO2 100%  HEMODYNAMICS: CVP:  [7 mmHg-13 mmHg] 10 mmHg  VENTILATOR SETTINGS: Vent Mode:  [-] CPAP;PSV FiO2 (%):  [40 %] 40 % Set Rate:  [24 bmp] 24 bmp Vt Set:  [450 mL] 450 mL PEEP:  [5 cmH20] 5 cmH20 Pressure Support:  [5 cmH20] 5 cmH20 Plateau Pressure:  [14 cmH20] 14 cmH20  INTAKE / OUTPUT: I/O last 3 completed shifts: In: 4303 [I.V.:1758; NW/GN:5621; IV Piggyback:630] Out: 4050 [Urine:4050]  PHYSICAL EXAMINATION: General:  No  acute distress. Off sedation, unresponsive. Integument:  Warm & dry. No rash on exposed skin.  Lymphatics:  No appreciated cervical or supraclavicular lymphadenoapthy. HEENT:  No scleral injection or icterus.  Endotracheal tube in place. PERRL. Cardiovascular:  Regular rate. No edema. No appreciable JVD.  Pulmonary:  Good aeration & clear to auscultation bilaterally. Symmetric chest wall rise on ventilator. Abdomen: Soft. Normal bowel sounds. Protuberant. Musculoskeletal:  Normal bulk. Hand grip strength 5/5 bilaterally. No joint deformity or effusion appreciated. Neurological:  Gag and respiratory drive intact, pupils are sluggish but reactive, posturing. Psychiatric:  Unable to assess given altered mental status.  LABS:  BMET  Recent Labs Lab 01/24/16 0426 01/25/16 0556 01/26/16 0501  NA 136 139 141  K 4.7 3.7 3.1*  CL 113* 109 109  CO2 16* 20* 22  BUN CREATININE 0.82 0.70 0.61  GLUCOSE 155* 166* 300*   Electrolytes  Recent Labs Lab 01/24/16 0426 01/25/16 0556 01/26/16 0501  CALCIUM 9.0 9.3 9.4  MG 1.7 1.6* 1.7  PHOS 4.0 3.3 2.6   CBC  Recent Labs Lab 01/24/16 0426 01/25/16 0556 01/26/16 0501  WBC 9.0 7.7 8.9  HGB 11.4* 10.5* 10.9*  HCT 36.8 33.2* 35.3*  PLT 123* 116* 143*   Coag's  Recent Labs Lab February 10, 2016 0825 2016-02-10 1317 Feb 10, 2016 2120  APTT INR 1.27 1.22 1.27   Sepsis Markers  Recent Labs Lab 02/10/16 0911  February 10, 2016 2030 02-10-16 2350 01/22/16 0305 01/23/16 0350  LATICACIDVEN  --   < > 1.0 0.9 1.5  --   PROCALCITON <0.10  --   --   --  0.57 0.74  < > = values in this interval not displayed.  ABG  Recent Labs Lab 01/24/16 0315 01/25/16 0332 01/26/16 0410  PHART 7.314* 7.352 7.416  PCO2ART 32.2* 32.3* 35.3  PO2ART 187* 200* 153*   Liver Enzymes  Recent Labs Lab 2016-01-31 0658  AST 39  ALT 33  ALKPHOS 207*  BILITOT 1.8*  ALBUMIN 4.4   Cardiac Enzymes  Recent Labs Lab 2016-01-31 1746 2016-01-31 2035  2016-01-31 2320  TROPONINI 0.23* 0.13* 0.10*   Glucose  Recent Labs Lab 01/25/16 1134 01/25/16 1604 01/25/16 1926 01/26/16 0031 01/26/16 0446 01/26/16 0841  GLUCAP 207* 234* 193* 270* 268* 289*   Imaging Dg Chest Port 1 View  01/26/2016  CLINICAL DATA:  Check endotracheal tube placement EXAM: PORTABLE CHEST 1 VIEW COMPARISON:  01/25/2016 FINDINGS: Endotracheal tube is again noted 2.7 cm above the carina. A nasogastric catheter and left jugular central line are again seen and stable. The overall inspiratory effort is poor with crowding of the vascular markings. No focal confluent infiltrate is seen. The cardiac shadow is stable. IMPRESSION: Poor inspiratory effort with crowding of vascular markings. No focal infiltrate is seen. Tubes and lines as described. Electronically Signed   By: Alcide CleverMark  Lukens M.D.   On: 01/26/2016 07:19   STUDIES:  Port CXR 3/19:  Personally reviewed by me. ETT too high. No focal opacity or effusion. EKG 3/19:  Sinus tachycardia. QTc 669ms. LAE. ST segment depression in lateral leads.  CULTURES: MRSA PCR 3/19>>>  ANTIBIOTICS: Zosyn 3/19>>>  SIGNIFICANT EVENTS: 3/01 - Discharge after hospitalization for alcoholic pancreatitis & hypertensive urgency  LINES/TUBES: OETT 7.0 3/19>>> OGT 3/19>>> FOLEY 3/19>>> PIV x2>>>  ASSESSMENT / PLAN:  PULMONARY A: Acute Respiratory Failure - Post arrest.  P:   Terminal extubation.  CARDIOVASCULAR A:  Cardiac Arrest H/O HTN  P:  D/C tele.  RENAL A:   Hypomag.  P:   D/C blood draws.  GASTROINTESTINAL A:   H/O Pancreatitis - Secondary to EtOH. Lipase normal.  P:   D/C blood draws.  HEMATOLOGIC A:   No acute issues.  P:  D/C blood draws.  INFECTIOUS A:   No acute issue.  P:   D/C abx.  ENDOCRINE A:   H/O DM Type II - A1c  6.7 on 3/2.  P:   D/C CBGs.  NEUROLOGIC A:   Possible Seizure - During IV Dilaudid push in ED. Sedation on Ventilator H/O Depression  P:   Morphine for  comfort.  FAMILY  - Updates:  Family updated bedside, will proceed with withdrawal of care.  - Inter-disciplinary family meet or Palliative Care meeting due by:  3/26  The patient is critically ill with multiple organ systems failure and requires high complexity decision making for assessment and support, frequent evaluation and titration of therapies, application of advanced monitoring technologies and extensive interpretation of multiple databases.   Critical Care Time devoted to patient care services described in this note is  35  Minutes. This time reflects time of care of this signee Dr Koren BoundWesam Yacoub. This critical care time does not reflect procedure time, or teaching time or supervisory time of PA/NP/Med student/Med Resident etc but could involve care discussion time.  Alyson ReedyWesam G. Yacoub, M.D. [facility] Pulmonary/Critical Care Medicine. Pager: [phone]. After hours pager: [phone].

## 2016-01-26 NOTE — Progress Notes (Signed)
[facility] FOR AM LAB REPLACEMENT ONLY  The patient does apply [facility] Electroly[facility] Protocol based on the criteria listed below:   1. Is GFR >/= 40 ml/min? Yes.    Patient's GFR today is >60 2. Is urine output >/= 0.5 ml/kg/hr for the last 6 hours? Yes.   Patient's UOP is 1.8 ml/kg/hr 3. Is BUN < 60 mg/dL? Yes.    Patient's BUN today is 9 4. Abnormal electrolyte(s):  K - 3.1 5. Ordered repletion with: PER PROTOCOL 6. If a panic level lab has been reported, has the CCM MD in charge been notified? Yes.  .   Physician:  Dr. Ocie CornfieldSommer  Layni Kreamer C Patirica Longshore 01/26/2016 6:15 AM

## 2016-01-26 NOTE — Progress Notes (Signed)
Subjective: Intubated and off all sedation  Exam: Filed Vitals:   01/26/16 0600 01/26/16 0700  BP: 104/86 128/68  Pulse: 116 119  Temp: 99.7 F (37.6 C) 99.3 F (37.4 C)  Resp: 19 27    Mental Status: Patient does not respond to verbal stimuli.  Extensor posturing on the left to deep sternal rub.  Does not follow commands.  No verbalizations noted.  Cranial Nerves: II: patient does not respond confrontation bilaterally, pupils right 2 mm, left 2 mm,and reactive bilaterally III,IV,VI: doll's response present bilaterally. V,VII: corneal reflex present bilaterally  VIII: patient does not respond to verbal stimuli IX,X: gag reflex absent, XI: trapezius strength unable to test bilaterally XII: tongue strength unable to test Motor/Sensory: Extremities flaccid throughout.  Triple reflex bilateral LE and extensor posturing left arm. No purposeful movements. Decerebrate and decorticate posturing occurs intermittently as well as in response to noxious stimuli.  Deep Tendon Reflexes: Hyper-reflexic throughout Plantars: upgoing bilaterally Cerebellar: Unable to perform  Pertinent Labs: none   Impression: 1. 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  2. Exam performed today is > 72 hours after normothermia has been achieved following cooling protocol. Off all sedation. She has brainstem reflexes but exhibits posturing and no purposeful movements to noxious stimuli. Exam most consistent with diffuse anoxic brain damage with a poor prognosis for meaningful recovery. Discussed with her health care power of attorney.   Recommendations: 1. Consider palliative care.   2. Keppra can be continued unless family decides to withdraw medical treatment and initiate palliative care pathway.     Caryl PinaEric Eleanora Guinyard, MD 01/26/2016, 10:04 AM

## 2016-01-26 NOTE — Progress Notes (Signed)
Nutrition Brief Note  Chart reviewed.  Pt now transitioning to comfort care. No further nutrition interventions warranted at this time. Please re-consult as needed.   Prabhjot Maddux, Dietetic Intern Pager: [phone]  

## 2016-01-26 NOTE — Procedures (Signed)
Extubation Procedure Note  Patient Details:   Name: Alyssa Garcia DOB: 05/08/1961 MRN: [id number]   Airway Documentation:     Evaluation  O2 sats: currently acceptable Complications: No apparent complications Patient did tolerate procedure well. Bilateral Breath Sounds: Clear Suctioning: Oral No   Pt extubated using withdrawal of life orders.   Ok AnisKelly Smith, MA 01/26/2016, 12:01 PM

## 2016-01-26 NOTE — Progress Notes (Signed)
ETT was at 20cm at the lip. Pt still getting volumes on the vent, RT advanced ETT to 24cm at the lip. RN at bedside. RT will continue to monitor. PT tolerated well.

## 2016-01-26 NOTE — Progress Notes (Signed)
   01/26/16 1400  Clinical Encounter Type  Visited With Patient;Patient and family together;Health care provider  Visit Type Initial;Psychological support;Spiritual support;Social support;Patient actively dying  Referral From Chaplain;Nurse  Consult/Referral To Faith community  Spiritual Encounters  Spiritual Needs Prayer;Emotional  Stress Factors  Family Stress Factors Exhausted;Health changes;Loss of control;Major life changes   Chaplain visited with Pt. And Pt.'s friend And provided end-of-life care via prayer.

## 2016-01-27 DIAGNOSIS — I420 Dilated cardiomyopathy: Secondary | ICD-10-CM

## 2016-01-27 LAB — CULTURE, RESPIRATORY: SPECIAL REQUESTS: NORMAL

## 2016-01-27 LAB — CULTURE, RESPIRATORY W GRAM STAIN

## 2016-01-27 MED ORDER — LORAZEPAM 2 MG/ML IJ SOLN
2.0000 mg | INTRAMUSCULAR | Status: DC | PRN
  Administered 2016-01-27 – 2016-01-29 (×8): 2 mg via INTRAVENOUS
  Administered 2016-01-29: 4 mg via INTRAVENOUS
  Administered 2016-01-29 (×3): 2 mg via INTRAVENOUS
  Filled 2016-01-27 (×3): qty 1
  Filled 2016-01-27: qty 2
  Filled 2016-01-27 (×9): qty 1

## 2016-01-27 MED ORDER — ATROPINE SULFATE 1 % OP SOLN
2.0000 [drp] | Freq: Four times a day (QID) | OPHTHALMIC | Status: DC | PRN
  Administered 2016-01-27 – 2016-01-29 (×8): 2 [drp] via SUBLINGUAL
  Filled 2016-01-27: qty 5

## 2016-01-27 MED ORDER — FUROSEMIDE 10 MG/ML IJ SOLN
80.0000 mg | Freq: Once | INTRAMUSCULAR | Status: AC
  Administered 2016-01-27: 80 mg via INTRAVENOUS
  Filled 2016-01-27: qty 8

## 2016-01-27 MED ORDER — SCOPOLAMINE 1 MG/3DAYS TD PT72
1.0000 | MEDICATED_PATCH | TRANSDERMAL | Status: DC
  Administered 2016-01-27: 1.5 mg via TRANSDERMAL
  Filled 2016-01-27: qty 1

## 2016-01-27 NOTE — Progress Notes (Addendum)
Egress test performed and patient is complete bedrest and actively dying.

## 2016-01-27 NOTE — Progress Notes (Signed)
[facility]  S: trouble with upper airway secretions overnight  O:  Filed Vitals:   01/26/16 1400 01/26/16 1500 01/26/16 1540 01/26/16 1604  BP:    134/66  Pulse: 118 125 122 112  Temp:    99.7 F (37.6 C)  TempSrc:    Axillary  Resp: 19 19 19 18   Height:      Weight:    85.9 kg (189 lb 6 oz)  SpO2: 93% 93% 93% 95%   Gen: intermittently gasps for breaths HENT: NCAT PULM: upper airway rhonchi, shallow breathing followed by intermittent forceful gasps CV: RRR, S1,S2 Belly: soft Neuro: no response to external stimuli  No family bedside  Impression: Actively dying Cardiac arrest Ventricular fibrillation History of alcohol abuse History of alcoholic pancreatitis  Plan: Continue morphine Add scopolamine patch Add lasix Continue mouth care  Suspect she will die today  Heber CarolinaBrent McQuaid, MD [place] P[facility]M Pager: [phone] Cell: [phone] After 3pm or if no response, call [phone]

## 2016-01-28 MED ORDER — ACETAMINOPHEN 10 MG/ML IV SOLN
1000.0000 mg | Freq: Four times a day (QID) | INTRAVENOUS | Status: AC | PRN
  Administered 2016-01-28 – 2016-01-29 (×3): 1000 mg via INTRAVENOUS
  Filled 2016-01-28 (×3): qty 100

## 2016-01-28 NOTE — Progress Notes (Signed)
[facility]  S: started on O2 by RN; fever overnight  O:  Filed Vitals:   01/26/16 1500 01/26/16 1540 01/26/16 1604 01/27/16 0800  BP:   134/66 112/68  Pulse: 125 122 112 52  Temp:   99.7 F (37.6 C) 98.3 F (36.8 C)  TempSrc:   Axillary Oral  Resp: 19 19 18 16   Height:      Weight:   85.9 kg (189 lb 6 oz)   SpO2: 93% 93% 95% 98%   Gen: slow breathing, quite HENT: NCAT PULM: clear lungs today, slow respiratory rate CV: RRR, S1,S2 Belly: soft Neuro: no response to external stimuli  No family bedside  Impression: Actively dying Cardiac arrest Ventricular fibrillation History of alcohol abuse History of alcoholic pancreatitis  Plan: Continue morphine Continue scopolamine patch Add lasix Continue mouth care Prn tyelnol Change oxygen to medical air  Suspect she will die today  Heber CarolinaBrent Kenishia Plack, MD [place] [facility] Pager: [phone] Cell: [phone] After 3pm or if no response, call [phone]

## 2016-01-28 NOTE — Progress Notes (Addendum)
Tried to switch patient to medical air as discussed with physician earlier. Patient did not maintain saturation levels above 74%. Placed oxygen back on and patient is at 94%. Discussed with Rosey Batheresa (poa) via telephone, oxygen was for comfort care.

## 2016-01-29 LAB — CULTURE, BLOOD (ROUTINE X 2)
CULTURE: NO GROWTH
CULTURE: NO GROWTH

## 2016-01-29 NOTE — Progress Notes (Addendum)
[facility] PCCM  S: Family believes that we are adequately controlling her comfort. Some audible gurgling with respiration, however, on scopolamine and atropine.   Ceasar Mons:  Filed Vitals:   01/28/16 1421 01/28/16 1530 01/28/16 1600 01/29/16 0548  BP:    129/58  Pulse: 112 119 113 114  Temp: 102.9 F (39.4 C) 101.9 F (38.8 C) 100.3 F (37.9 C) 100 F (37.8 C)  TempSrc: Axillary Axillary Axillary Axillary  Resp: 5 4 4    Height:      Weight:      SpO2: 93%  94% 92%   Gen: Slow, mildly labored breathing HENT: NCAT PULM: clear lungs today, slow respiratory rate, upper airway secretions CV: RRR, S1,S2 Belly: soft Neuro: no response to external stimuli  Impression: Actively dying Cardiac arrest Ventricular fibrillation History of alcohol abuse History of alcoholic pancreatitis  Plan: Continue morphine > bolus now to decrease WOB Continue scopolamine patch, atropine, lasix Prn tyelnol  Suspect she will die today  Joneen RoachPaul Hoffman, AGACNP-BC [facility] Pager [phone] or [phone]  01/29/2016 12:34 PM  PCCM Attending Note: Patient seen and examined with nurse practitioner. Please refer to his note which I reviewed in detail. Continuing full palliative care support for this patient. Continuing morphine drip. Scopolamine patch in place. Patient appears largely comfortable.  Donna ChristenJennings E. Jamison NeighborNestor, M.D. [facility] Pager:  [phone] After 3pm or if no response, call [phone] 2:26 PM 01/29/2016

## 2016-01-29 NOTE — Progress Notes (Signed)
Wasted 15ml of morphine 1mg /ml infusion bag in sink with Raymond GurneyLanisha, Charity fundraiserN.

## 2016-01-30 ENCOUNTER — Encounter (HOSPITAL_COMMUNITY): Payer: Self-pay | Admitting: Neurology

## 2016-01-31 ENCOUNTER — Telehealth: Payer: Self-pay

## 2016-01-31 NOTE — Telephone Encounter (Signed)
On 01/31/2016 I received a death certificate from [employer] (original). The death certificate is for cremation. The patient is a patient of Designer, television/film setDoctor Nestor. The [facility] this pm for signature. On 02/01/2016 I received the death certificate back from Doctor Grove HillNestor. I got the death certificate ready and called the funeral home to let them know the death certificate is ready for pickup.

## 2016-02-03 NOTE — Progress Notes (Signed)
Endorsed patient to float RN around 0020.  Patient still noted to have mild labor breathing. Morphine 10 mg bolus was earlier given and morphine drip increased to 12.5 mg/hr.  Patient was suctioned orally with Mosie LukesYankuer and moisturizer applied to lips and mucuosa. Radial pulses are still moderate bilaterally.

## 2016-02-03 NOTE — Discharge Summary (Signed)
DEATH NOTE: For complete accounting of the patient's history and physical exam on presentation please refer to the H&P dictated by me on 01/16/2016. Patient suffered cardiac arrest with subsequent myoclonic convulsions post arrest in the emergency department after presenting with abdominal discomfort, nausea, and vomiting. Patient was subsequently intubated post arrest and underwent therapeutic hypothermia protocol. After rewarming patient continued to demonstrate signs of diffuse anoxic brain injury off sedation and over 72 hours after normothermia was achieved. Patient was evaluated by neurology and all physicians agreed that the patient had a poor prognosis for meaningful recovery. This was discussed with the family and on 3/24 patient was transitioned to full withdrawal of care and comfort care. Patient was started on morphine for any potential relief of pain and/or dyspnea as well as scopolamine patch for secretions. Patient died on 2016/04/11 some time in the morning after approximately 12:20 AM because on my review of documentation by nursing staff patient was noted to be having mildly labored breathing at that time still.  DIAGNOSES AT DEATH: 1. Acute hypoxic respiratory failure 2. Severe anoxic brain injury 3. Pulseless electrical activity arrest 4. Aspiration 5. Cardiomyopathy 6. History of type 2 diabetes mellitus 7. History of pancreatitis 8. History of hyperlipidemia 9. History of depression

## 2016-02-03 NOTE — Progress Notes (Unsigned)
Morphine 140 cc wasted  In sink with Verlee Monte(Dora RN, Insurance account managerMonica RN)

## 2016-02-03 DEATH — deceased

## 2017-07-17 IMAGING — CT CT ABD-PELV W/ CM
2 of 5 series · 15 of 46 positions shown, 17 images · IV contrast (omnipaque)
Comparison: 12/31/2015 ; report dated 02/06/2000

CLINICAL DATA: Mid abdominal pain with nausea vomiting and
diarrhea. Elevated serum lipase level

EXAM:
CT ABDOMEN AND PELVIS WITH CONTRAST
TECHNIQUE: Multidetector CT imaging of the abdomen and pelvis was performed
using the standard protocol following bolus administration of
intravenous contrast.
CONTRAST:  100mL OMNIPAQUE IOHEXOL 300 MG/ML SOLN, 25mL OMNIPAQUE
IOHEXOL 300 MG/ML SOLN

[Series 2: abd/pel with · axial · 0.77mm/px · z∈[-732,-302]mm · 12 of 98 slices shown, 14 images]
[im 6/98  soft-tissue]
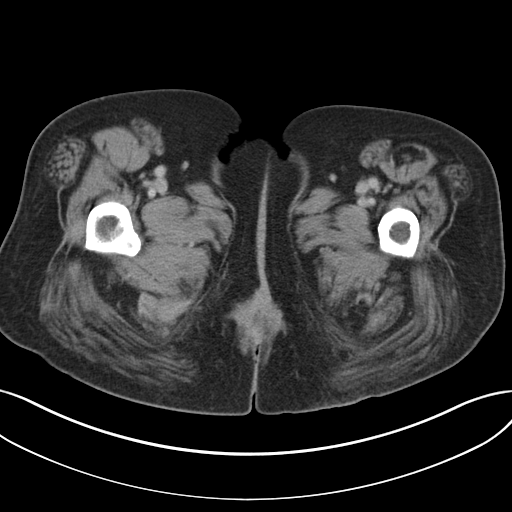
[im 6/98  bone]
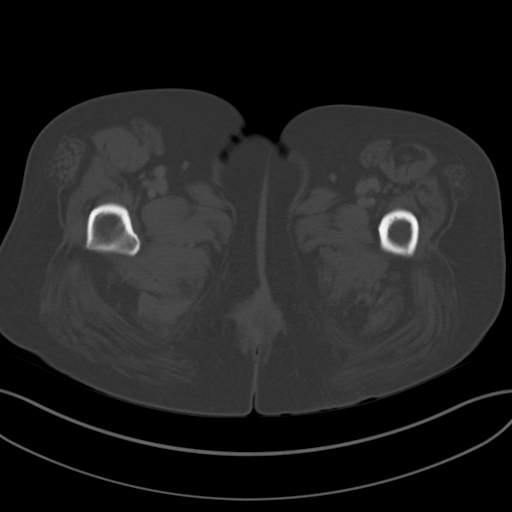
[im 16/98  soft-tissue]
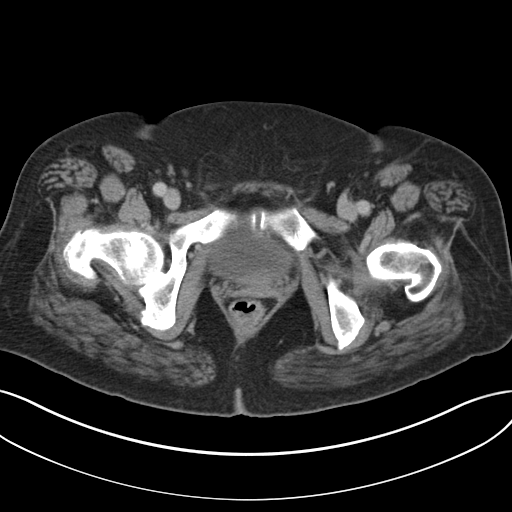
[im 21/98  soft-tissue]
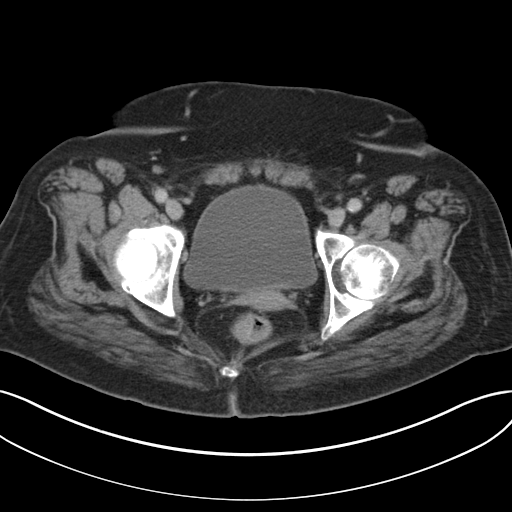
[im 31/98  soft-tissue]
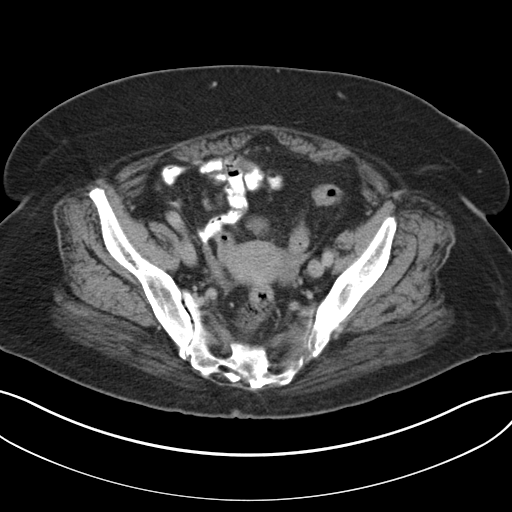
[im 36/98  soft-tissue]
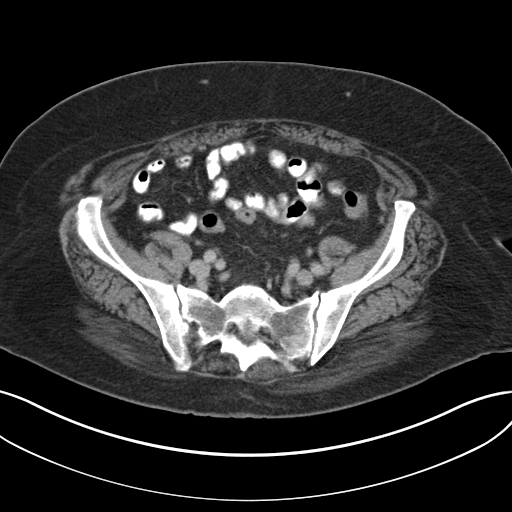
[im 46/98  soft-tissue]
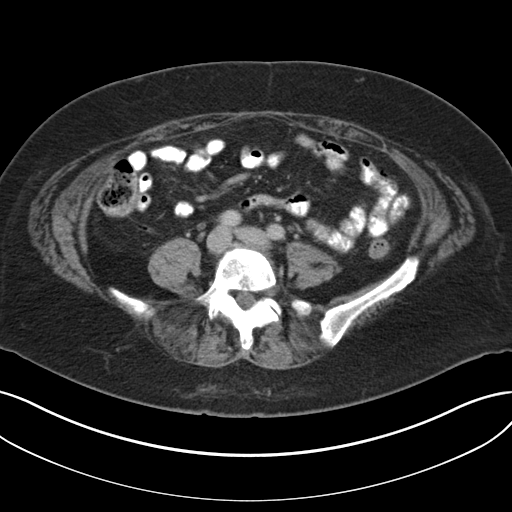
[im 52/98  soft-tissue]
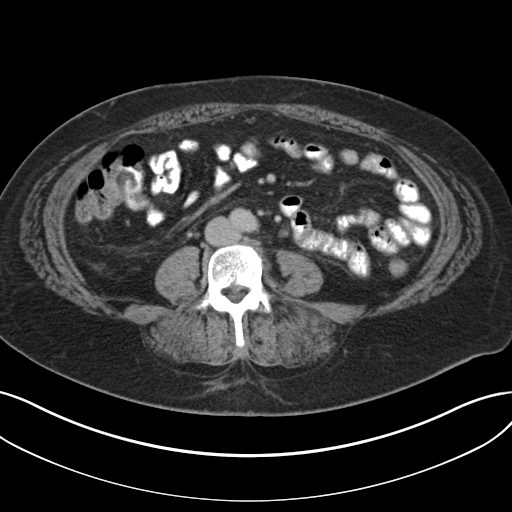
[im 62/98  soft-tissue]
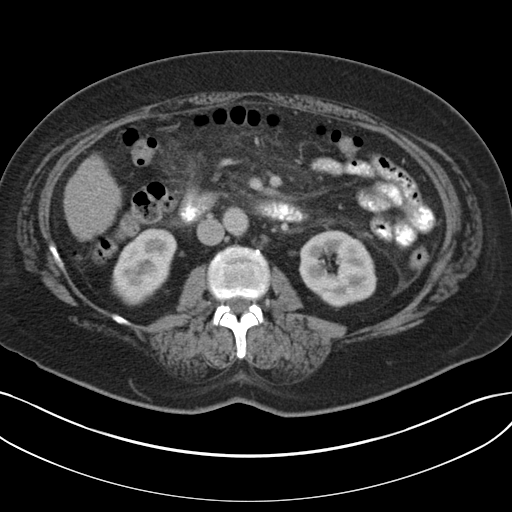
[im 67/98  soft-tissue]
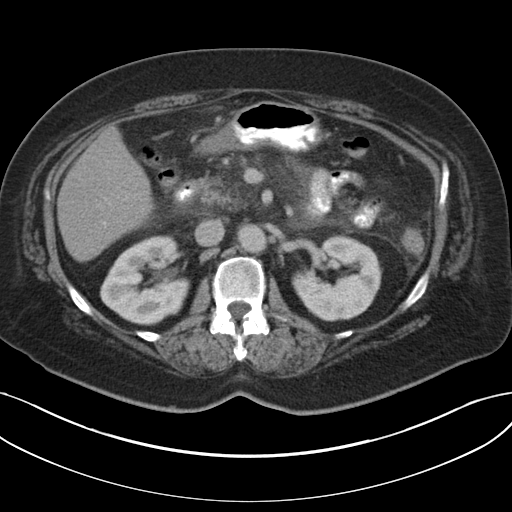
[im 67/98  bone]
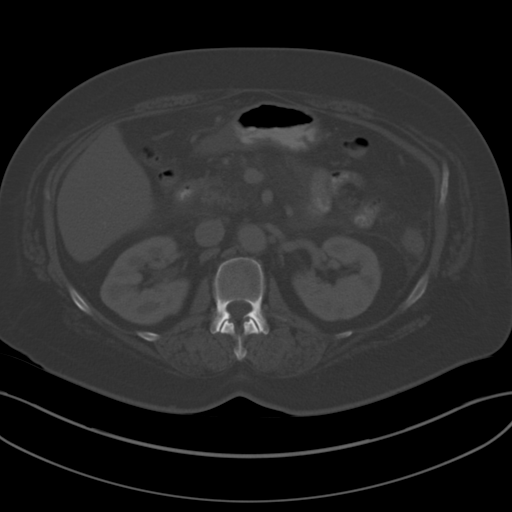
[im 77/98  soft-tissue]
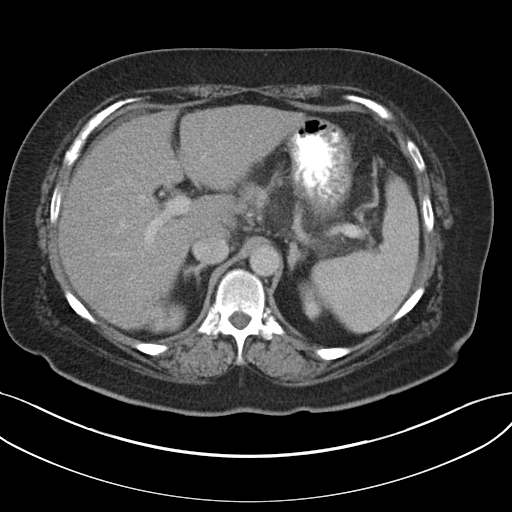
[im 82/98  soft-tissue]
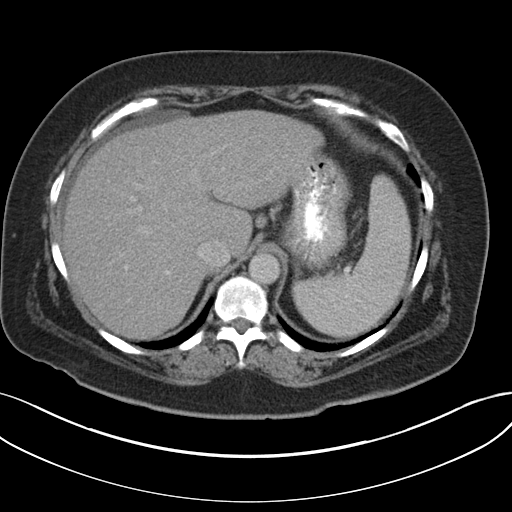
[im 92/98  soft-tissue]
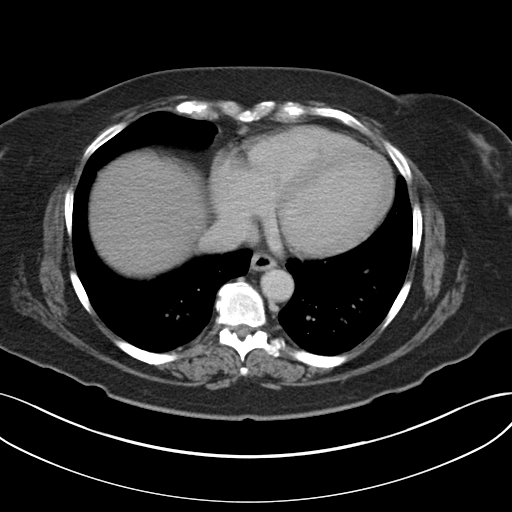

[Series 4: coronal a/|p · coronal · 0.74mm/px · 3 of 126 slices shown]
[im 42/126  soft-tissue]
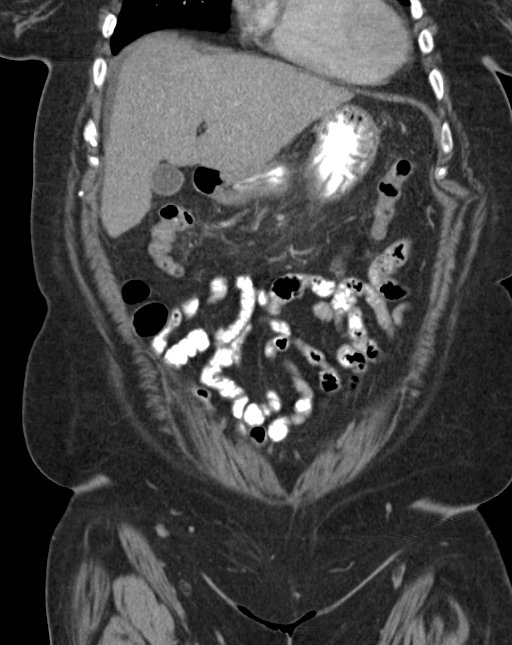
[im 56/126  soft-tissue]
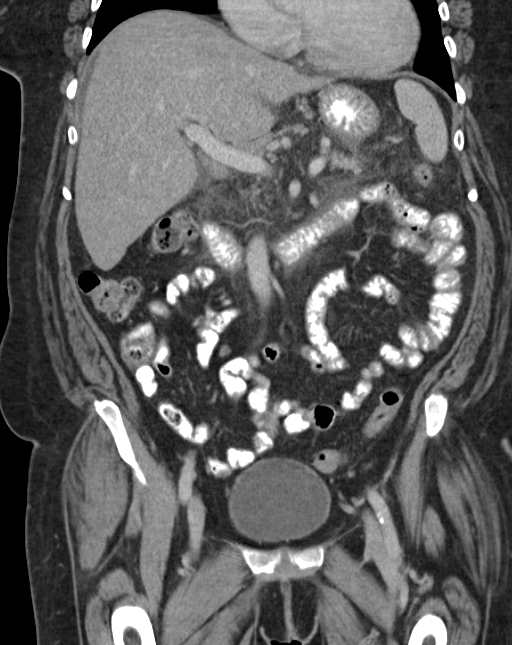
[im 70/126  soft-tissue]
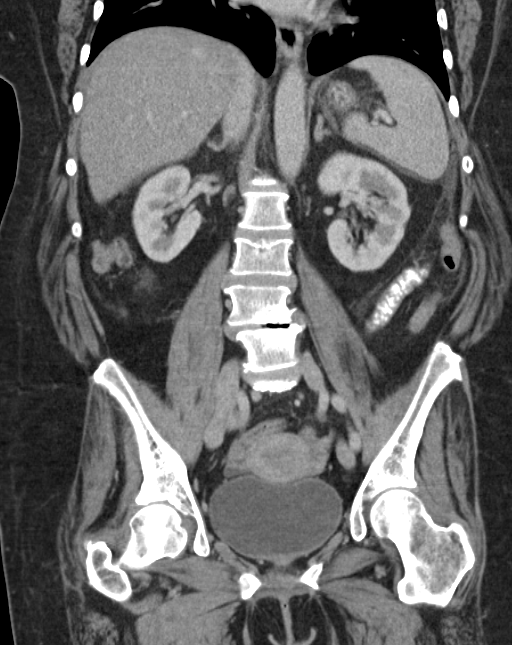

[15 of 46 positions shown; findings below may reference images not displayed]

FINDINGS: Lower chest:  Mild cardiomegaly.

Hepatobiliary: Mild diffuse hepatic steatosis.

Pancreas: Diffuse peripancreatic stranding compatible with acute
pancreatitis. No findings of necrosis, abscess, or pseudocyst.

Spleen: Unremarkable

Adrenals/Urinary Tract: Unremarkable

Stomach/Bowel: Unremarkable

Vascular/Lymphatic: Upper peripancreatic lymph node 1.5 cm in short
axis on image 22 series 2. Portacaval node 1.3 cm in short axis,
image 24 series 2. Porta hepatis node 1.3 cm in short axis, image 27
series 2. These lymph nodes are likely reactive.

Reproductive: Unremarkable

Other: Small amount of perihepatic and perisplenic ascites. Trace
fluid in the paracolic gutters.

Musculoskeletal: Lower lumbar spondylosis and degenerative disc
disease. A transitional lumbosacral vertebra is assumed to represent
the S1 level. Careful correlation with this numbering strategy prior
to any procedural intervention would be recommended. There is
potentially prominent central narrowing of the thecal sac at the
L4-5 level especially eccentric to the left due to disc bulge and
disc protrusion along with at least mild left foraminal stenosis at
this level. There is also intervertebral spurring at the L5-S1
level.

Infraumbilical hernia contains adipose tissue.
IMPRESSION: 1. Acute pancreatitis. No pancreatic abscess, pseudocyst, or
necrosis. Peripancreatic stranding observed with a small amount of
perihepatic and perisplenic ascites. Surrounding porta hepatis
reactive lymph nodes.
2. Lumbar spondylosis and degenerative disc disease potentially with
considerable impingement at the L4-5 level (please note that S1 is
transitional).
3. Infraumbilical hernia contains adipose tissue.
4. Mild cardiomegaly.
5. Mild diffuse hepatic steatosis.

## 2017-07-17 IMAGING — CR DG ABDOMEN ACUTE W/ 1V CHEST
5 series · 5 of 5 positions shown · non-contrast
Comparison: None.

CLINICAL DATA: Patient with diffuse abdominal pain, nausea and
vomiting.

EXAM:
DG ABDOMEN ACUTE W/ 1V CHEST

[PA]
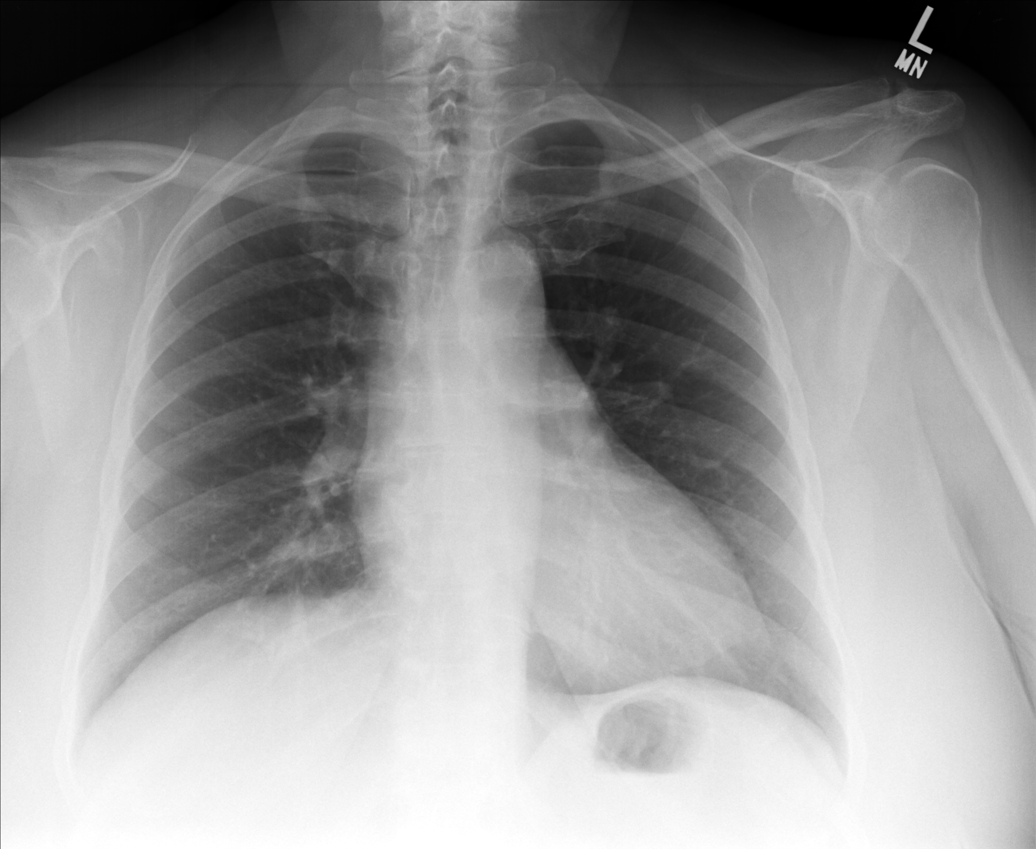

[AP (1 of 4)]
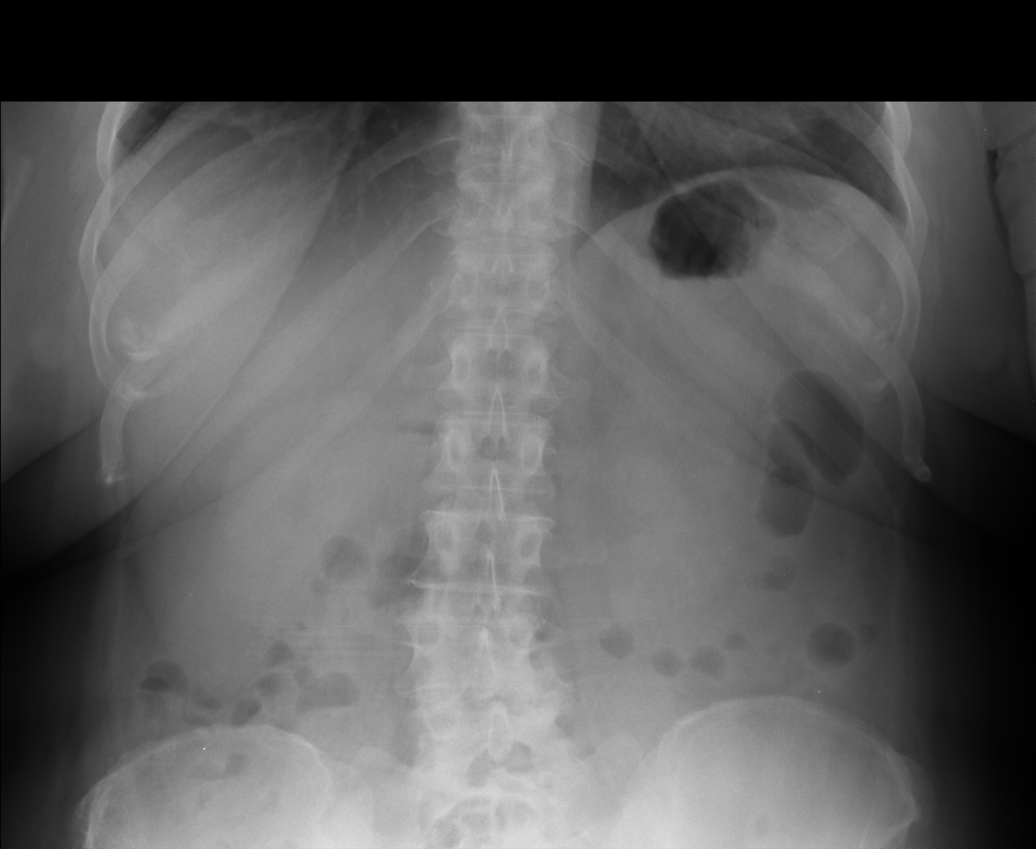

[AP (2 of 4)]
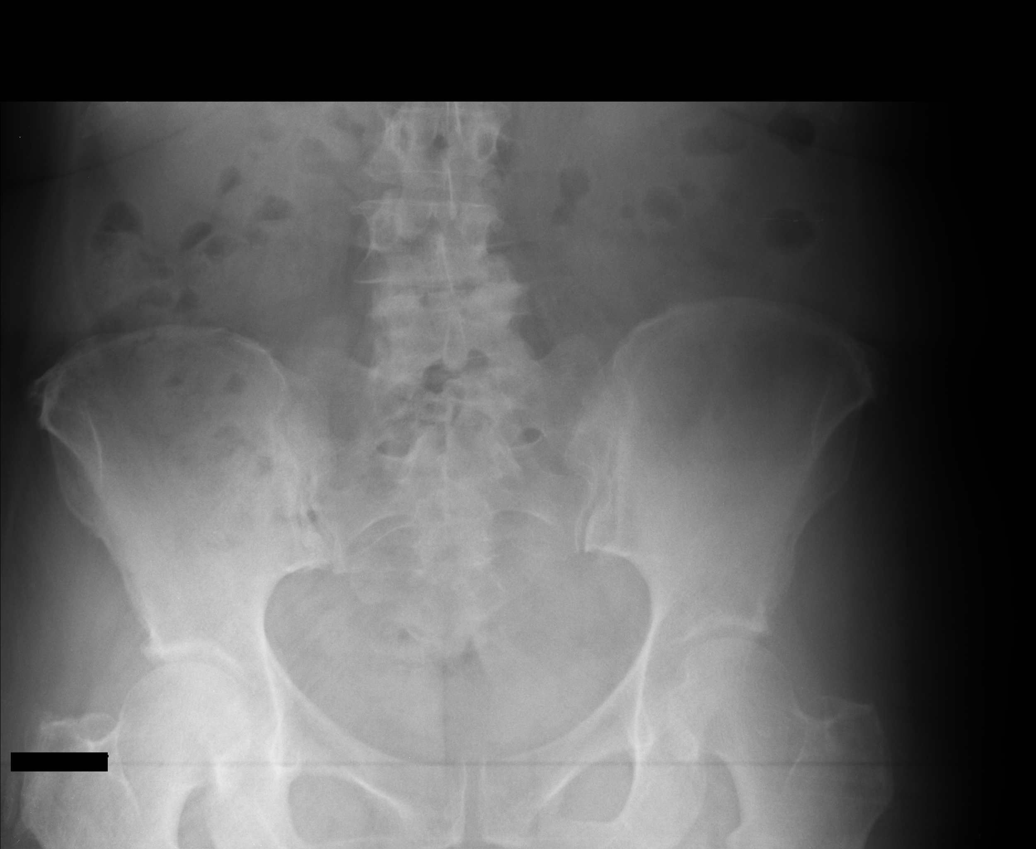

[AP (3 of 4)]
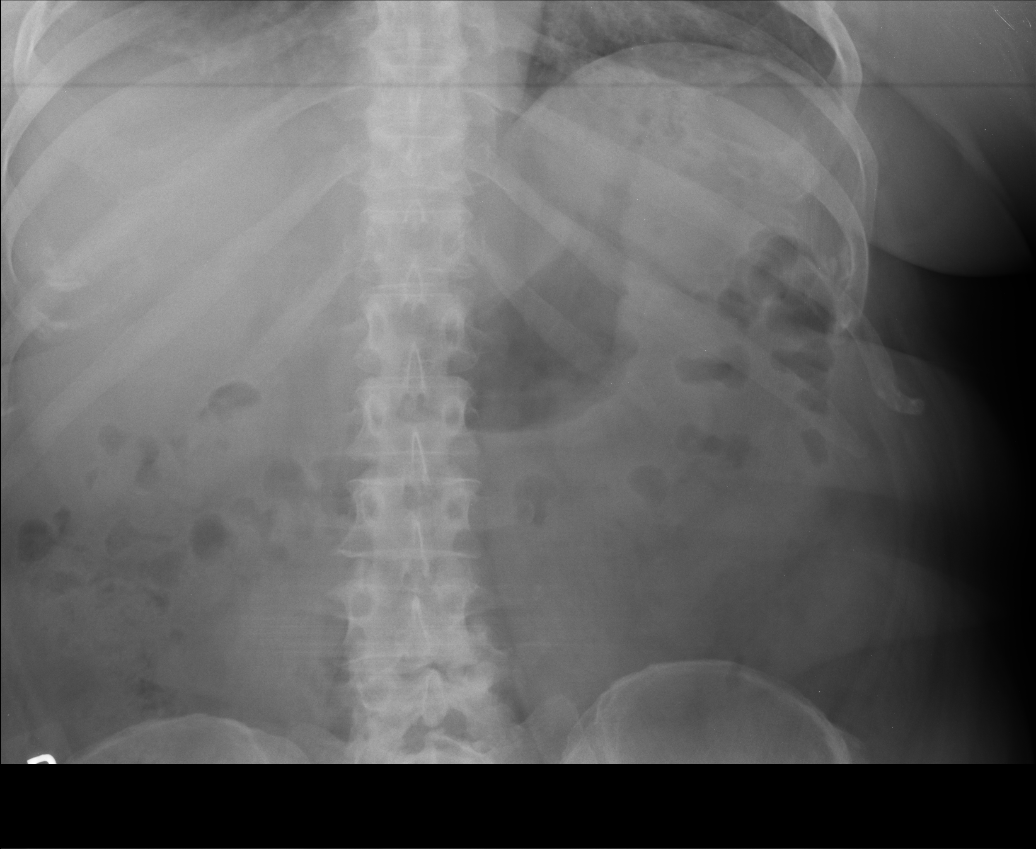

[AP (4 of 4)]
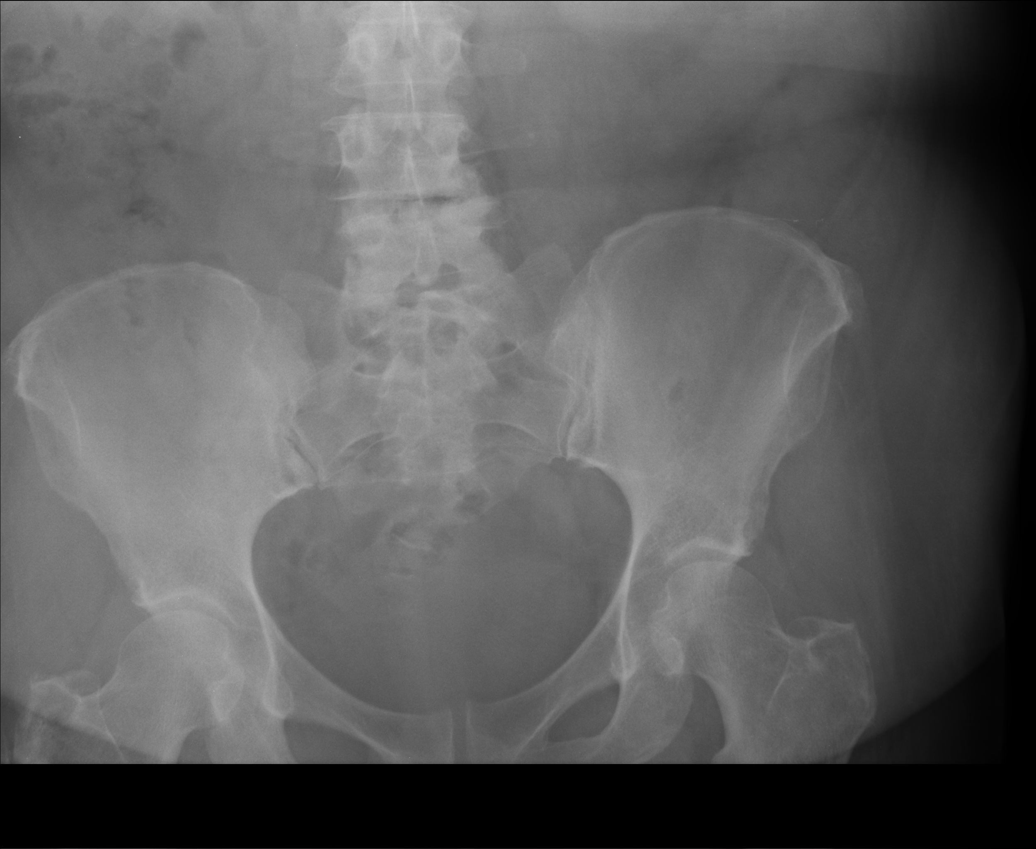

[5 of 5 positions shown; findings below may reference images not displayed]

FINDINGS: Normal cardiac and mediastinal contours. No consolidative pulmonary
opacities. No pleural effusion or pneumothorax.

Gas is demonstrated within nondilated loops of large and small bowel
in a nonobstructed pattern. No free intraperitoneal air.

Unremarkable osseous skeleton.
IMPRESSION: No acute cardiopulmonary process.

Nonobstructed bowel gas pattern.
